# Patient Record
Sex: Female | Born: 1935 | Race: White | Hispanic: No | Smoking: Never smoker
Health system: Southern US, Community
[De-identification: ages and names within clinical notes are randomized; demographics above are authoritative.]

## PROBLEM LIST (undated history)

## (undated) DIAGNOSIS — I10 Essential (primary) hypertension: Secondary | ICD-10-CM

## (undated) DIAGNOSIS — R739 Hyperglycemia, unspecified: Secondary | ICD-10-CM

## (undated) DIAGNOSIS — Z79899 Other long term (current) drug therapy: Secondary | ICD-10-CM

## (undated) DIAGNOSIS — E039 Hypothyroidism, unspecified: Secondary | ICD-10-CM

## (undated) DIAGNOSIS — Z7901 Long term (current) use of anticoagulants: Secondary | ICD-10-CM

## (undated) DIAGNOSIS — E785 Hyperlipidemia, unspecified: Secondary | ICD-10-CM

## (undated) DIAGNOSIS — D649 Anemia, unspecified: Secondary | ICD-10-CM

## (undated) DIAGNOSIS — M8580 Other specified disorders of bone density and structure, unspecified site: Secondary | ICD-10-CM

## (undated) DIAGNOSIS — E782 Mixed hyperlipidemia: Secondary | ICD-10-CM

## (undated) DIAGNOSIS — D259 Leiomyoma of uterus, unspecified: Secondary | ICD-10-CM

## (undated) DIAGNOSIS — I4891 Unspecified atrial fibrillation: Secondary | ICD-10-CM

## (undated) HISTORY — DX: Anemia, unspecified: D64.9

## (undated) HISTORY — DX: Other long term (current) drug therapy: Z79.899

## (undated) HISTORY — DX: Unspecified atrial fibrillation: I48.91

## (undated) HISTORY — DX: Hyperlipidemia, unspecified: E78.5

## (undated) HISTORY — DX: Essential (primary) hypertension: I10

## (undated) HISTORY — DX: Hypothyroidism, unspecified: E03.9

## (undated) HISTORY — DX: Mixed hyperlipidemia: E78.2

## (undated) HISTORY — DX: Leiomyoma of uterus, unspecified: D25.9

## (undated) HISTORY — PX: APPENDECTOMY: SHX54

## (undated) HISTORY — DX: Hyperglycemia, unspecified: R73.9

## (undated) HISTORY — DX: Long term (current) use of anticoagulants: Z79.01

## (undated) HISTORY — PX: BREAST LUMPECTOMY: SHX2

## (undated) HISTORY — DX: Other specified disorders of bone density and structure, unspecified site: M85.80

## (undated) HISTORY — PX: TONSILLECTOMY: SUR1361

## (undated) HISTORY — PX: HAND SURGERY: SHX662

## (undated) HISTORY — PX: KNEE SURGERY: SHX244

---

## 2010-05-12 ENCOUNTER — Other Ambulatory Visit (HOSPITAL_COMMUNITY)
Admission: RE | Admit: 2010-05-12 | Discharge: 2010-05-12 | Disposition: A | Discharge status: Home / Self Care | Payer: Medicare Other | Source: Ambulatory Visit | Attending: Oncology | Admitting: Oncology

## 2010-05-12 ENCOUNTER — Other Ambulatory Visit: Payer: Self-pay | Admitting: Oncology

## 2010-05-12 DIAGNOSIS — D509 Iron deficiency anemia, unspecified: Secondary | ICD-10-CM | POA: Insufficient documentation

## 2014-10-28 DIAGNOSIS — I480 Paroxysmal atrial fibrillation: Secondary | ICD-10-CM

## 2014-10-28 DIAGNOSIS — I10 Essential (primary) hypertension: Secondary | ICD-10-CM

## 2014-10-28 DIAGNOSIS — E785 Hyperlipidemia, unspecified: Secondary | ICD-10-CM

## 2014-10-28 HISTORY — DX: Essential (primary) hypertension: I10

## 2014-10-28 HISTORY — DX: Paroxysmal atrial fibrillation: I48.0

## 2014-10-28 HISTORY — DX: Hyperlipidemia, unspecified: E78.5

## 2014-10-29 ENCOUNTER — Ambulatory Visit (INDEPENDENT_AMBULATORY_CARE_PROVIDER_SITE_OTHER): Payer: Medicare Other | Admitting: Pulmonary Disease

## 2014-10-29 ENCOUNTER — Encounter: Payer: Self-pay | Admitting: Pulmonary Disease

## 2014-10-29 ENCOUNTER — Other Ambulatory Visit (INDEPENDENT_AMBULATORY_CARE_PROVIDER_SITE_OTHER): Payer: Medicare Other

## 2014-10-29 ENCOUNTER — Ambulatory Visit (INDEPENDENT_AMBULATORY_CARE_PROVIDER_SITE_OTHER)
Admission: RE | Admit: 2014-10-29 | Discharge: 2014-10-29 | Disposition: A | Discharge status: Home / Self Care | Payer: Medicare Other | Source: Ambulatory Visit | Attending: Pulmonary Disease | Admitting: Pulmonary Disease

## 2014-10-29 VITALS — BP 140/66 | HR 61 | Temp 98.2°F | Ht 64.0 in | Wt 132.6 lb

## 2014-10-29 DIAGNOSIS — R0600 Dyspnea, unspecified: Secondary | ICD-10-CM

## 2014-10-29 LAB — CBC
HCT: 37.9 % (ref 36.0–46.0)
Hemoglobin: 12.8 g/dL (ref 12.0–15.0)
MCHC: 33.7 g/dL (ref 30.0–36.0)
MCV: 87.7 fl (ref 78.0–100.0)
Platelets: 215 10*3/uL (ref 150.0–400.0)
RBC: 4.32 Mil/uL (ref 3.87–5.11)
RDW: 14.2 % (ref 11.5–15.5)
WBC: 7.4 10*3/uL (ref 4.0–10.5)

## 2014-10-29 NOTE — Patient Instructions (Signed)
Get chest X ray Echocardiogram (Ultrasound of heart) Return to clinic in 2 weeks

## 2014-10-29 NOTE — Progress Notes (Addendum)
Subjective:    Patient ID: Latasha Barber, female    DOB: 01/10/1929, 81 y.o.   MRN: [id number]  HPI  Referral for evaluation of dyspnea. Question of amiodarone toxicity.  Mrs. Buxbaum is a 81 year old with history of paroxysmal A. Fib, hypertension, dyslipidemia. She had a diagnosis of paroxysmal A. Fib. She is currently on amiodarone at 100 mg and Eliquis. She complains of dyspnea on exertion for the past 1-2 weeks associated with nonproductive cough. No complaints of sputum production, wheezing, fevers, chills. Has non productive cough. No hemoptysis, fatigue or weight loss. She saw her cardiologist Dr. Geraldo Pitter who has referred her for further evaluation. She had PFTs done at [facility] on 08/06/14 which showed an isolated reduction in diffusion capacity (see results below). ROS as below.  Social History: She is a lifelong nonsmoker. No drinking history or illegal drug use. She worked in a [employer]. No exposures at work or at home.  Past medical history: Paroxysmal atrial fibrillation Essential hypertension Dyslipidemia Anemia Hematochezia Herpes zoster Hypoglycemia Hypothyroidism Leiomyoma of the uterus.  Past surgical history: Appendectomy  Allergies: None  Current medications: Amiodarone 100 mg daily Eliquis 5 mg twice a day B vitamins Calcium carbonate vitamin D3 Synthroid 100 g daily Lisinopril-hydrochlorothiazide 20-12.5 mg daily Lovastatin 40 mg at night Multivitamin  Family history: Father-coronary artery disease Mother-colon cancer Daughter.-breast cancer  Review of Systems  Constitutional: Negative for fever and unexpected weight change.  HENT: Negative for congestion, dental problem, ear pain, nosebleeds, postnasal drip, rhinorrhea, sinus pressure, sneezing, sore throat and trouble swallowing.   Eyes: Negative for redness and itching.  Respiratory: Positive for cough and shortness of breath. Negative for chest tightness and wheezing.     Cardiovascular: Negative for palpitations and leg swelling.  Gastrointestinal: Negative for nausea and vomiting.  Genitourinary: Negative for dysuria.  Musculoskeletal: Negative for joint swelling.  Skin: Negative for rash.  Neurological: Negative for headaches.  Hematological: Bruises/bleeds easily.  Psychiatric/Behavioral: Negative for dysphoric mood. The patient is not nervous/anxious.    Data from [facility] hospital: PFTs (08/06/14)  FVC 2.56 (93%) FEV1 1.90 (99%) F/F 73 TLC 5.20 (90%) DLCO 48% No BD response.  Blood pressure 140/66, pulse 61, temperature 98.2 F (36.8 C), temperature source Oral, height 5\' 4"  (1.626 m), weight 132 lb 9.6 oz (60.147 kg), SpO2 99 %.    Objective:   Physical Exam  Constitutional: She is oriented to person, place, and time. She appears well-developed and well-nourished. No distress.  HENT:  Mouth/Throat: Oropharynx is clear and moist.  Eyes: Pupils are equal, round, and reactive to light.  Neck: Normal range of motion. Neck supple.  Cardiovascular: Normal rate and normal heart sounds.   Pulmonary/Chest: Effort normal and breath sounds normal.  Abdominal: Soft. Bowel sounds are normal.  Neurological: She is alert and oriented to person, place, and time.  Skin: Skin is warm and dry.       Assessment & Plan:   Ms. Skidmore is referred for evaluation of dyspnea and a question of amiodarone toxicity. She has been on amiodarone at 100 mg for at least 2 months as per her report. Complains of dyspnea for the past 2 weeks. Review of her PFTs shows isolated reduction in the diffusion capacity. There is no restriction or obstruction. This pattern is not very characteristic of interstitial lung disease or amiodarone toxicity. It is more suggestive of a pulmonary vascular process. I will get a chest x-ray today. If there are any abnormalities on it she will need  a high-resolution CT scan of the chest. I will also order an echocardiogram to evaluate for  pulmonary hypertension or any other cardiac abnormality. She does have history of anemia which could cause a low diffusion capacity. There is no need to stop amiodarone at this point until we have clear evidence of amiodarone toxicity.  I will see her in a month after these tests to review and plan for further workup.  Plan: Chest X ray CBC to evaluate anemia. Echocardiogram  Return to clinic in 1 month  Marshell Garfinkel MD [facility] Pager [phone] If no answer or after 3pm call: [phone] 10/29/2014, 2:40 PM

## 2014-11-01 ENCOUNTER — Other Ambulatory Visit (HOSPITAL_COMMUNITY): Payer: Medicare Other

## 2014-11-29 ENCOUNTER — Ambulatory Visit: Payer: Medicare Other | Admitting: Pulmonary Disease

## 2015-04-14 DIAGNOSIS — R739 Hyperglycemia, unspecified: Secondary | ICD-10-CM

## 2015-04-14 DIAGNOSIS — I4891 Unspecified atrial fibrillation: Secondary | ICD-10-CM | POA: Insufficient documentation

## 2015-04-14 DIAGNOSIS — Z79899 Other long term (current) drug therapy: Secondary | ICD-10-CM

## 2015-04-14 DIAGNOSIS — D259 Leiomyoma of uterus, unspecified: Secondary | ICD-10-CM

## 2015-04-14 DIAGNOSIS — D649 Anemia, unspecified: Secondary | ICD-10-CM

## 2015-04-14 DIAGNOSIS — E782 Mixed hyperlipidemia: Secondary | ICD-10-CM

## 2015-04-14 DIAGNOSIS — E039 Hypothyroidism, unspecified: Secondary | ICD-10-CM

## 2015-04-14 HISTORY — DX: Hypothyroidism, unspecified: E03.9

## 2015-04-14 HISTORY — DX: Leiomyoma of uterus, unspecified: D25.9

## 2015-04-14 HISTORY — DX: Anemia, unspecified: D64.9

## 2015-04-14 HISTORY — DX: Mixed hyperlipidemia: E78.2

## 2015-04-14 HISTORY — DX: Hyperglycemia, unspecified: R73.9

## 2015-04-14 HISTORY — DX: Other long term (current) drug therapy: Z79.899

## 2016-03-22 DIAGNOSIS — M8580 Other specified disorders of bone density and structure, unspecified site: Secondary | ICD-10-CM

## 2016-03-22 HISTORY — DX: Other specified disorders of bone density and structure, unspecified site: M85.80

## 2016-11-19 DIAGNOSIS — Z7901 Long term (current) use of anticoagulants: Secondary | ICD-10-CM

## 2016-11-19 HISTORY — DX: Long term (current) use of anticoagulants: Z79.01

## 2017-04-14 IMAGING — CR DG CHEST 2V
2 series · 2 of 2 positions shown · non-contrast
Comparison: None in PACs

CLINICAL DATA: One week of dyspnea, history of atrial fibrillation
and hypertension, nonsmoker.

EXAM:
CHEST  2 VIEW

[view not recorded (1 of 2)]
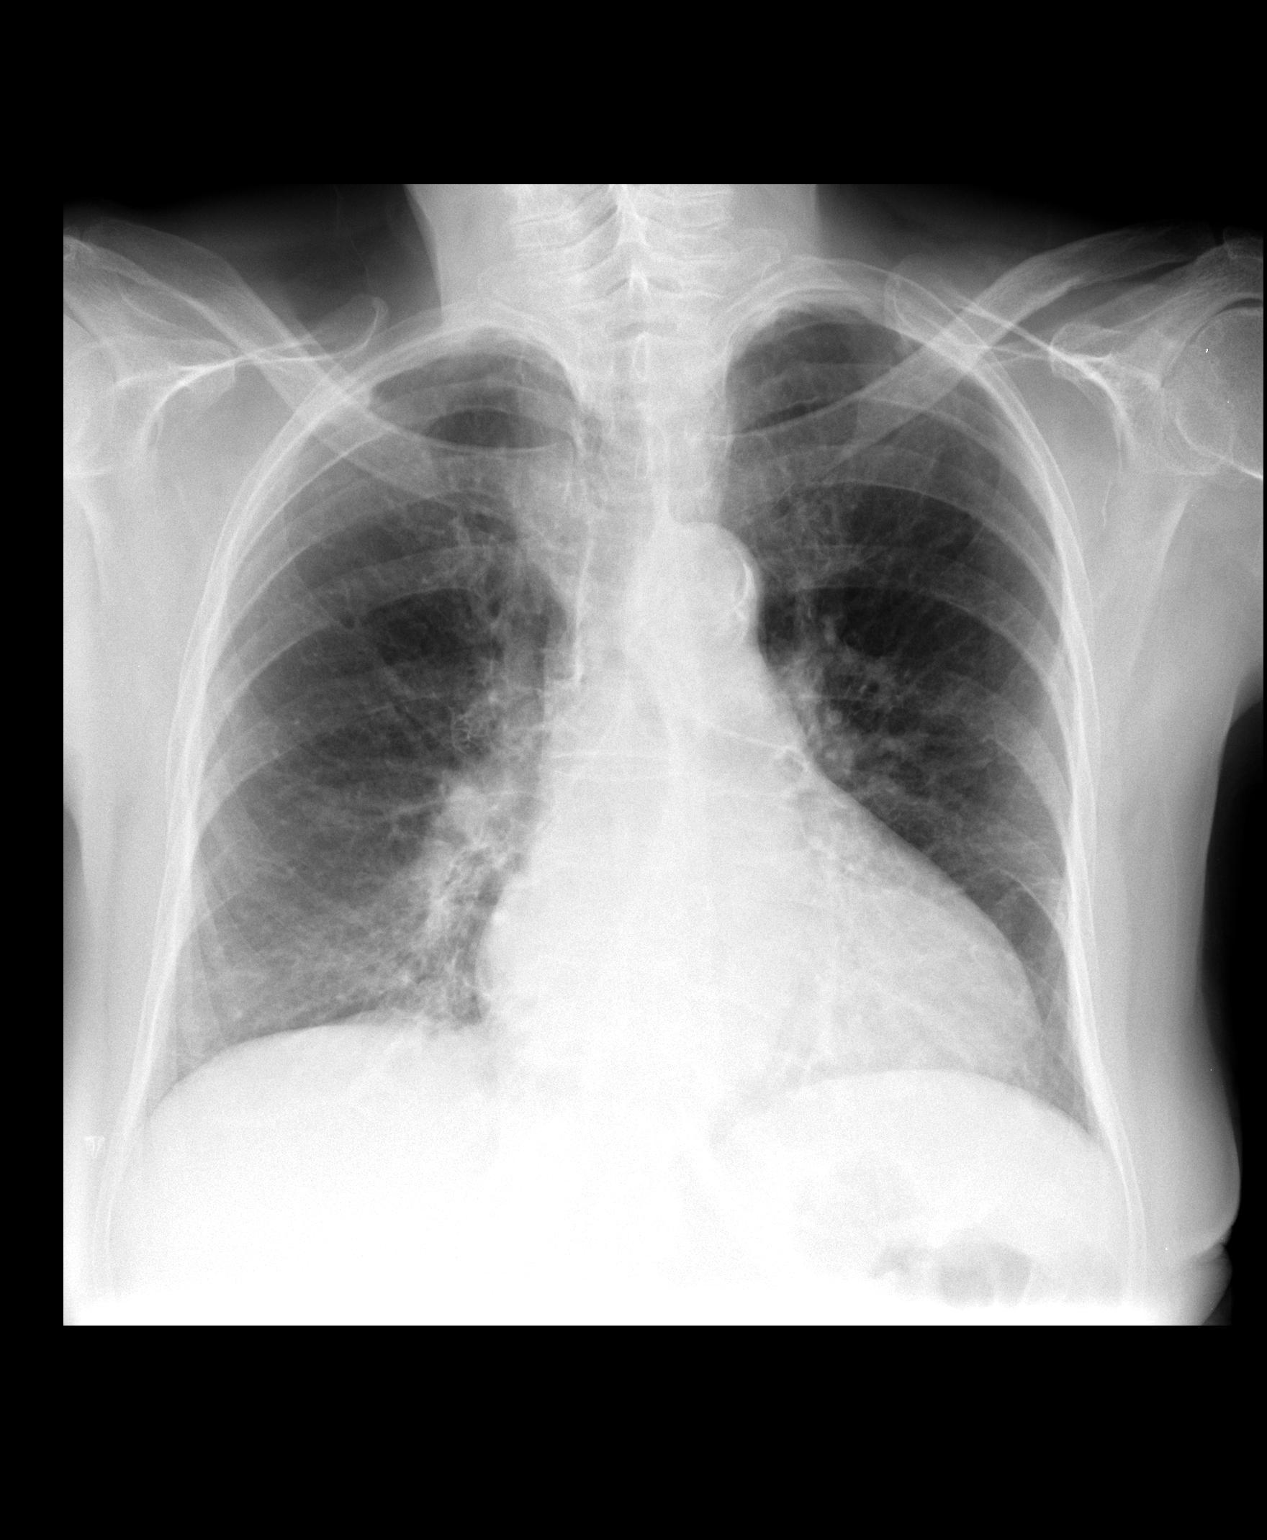

[view not recorded (2 of 2)]
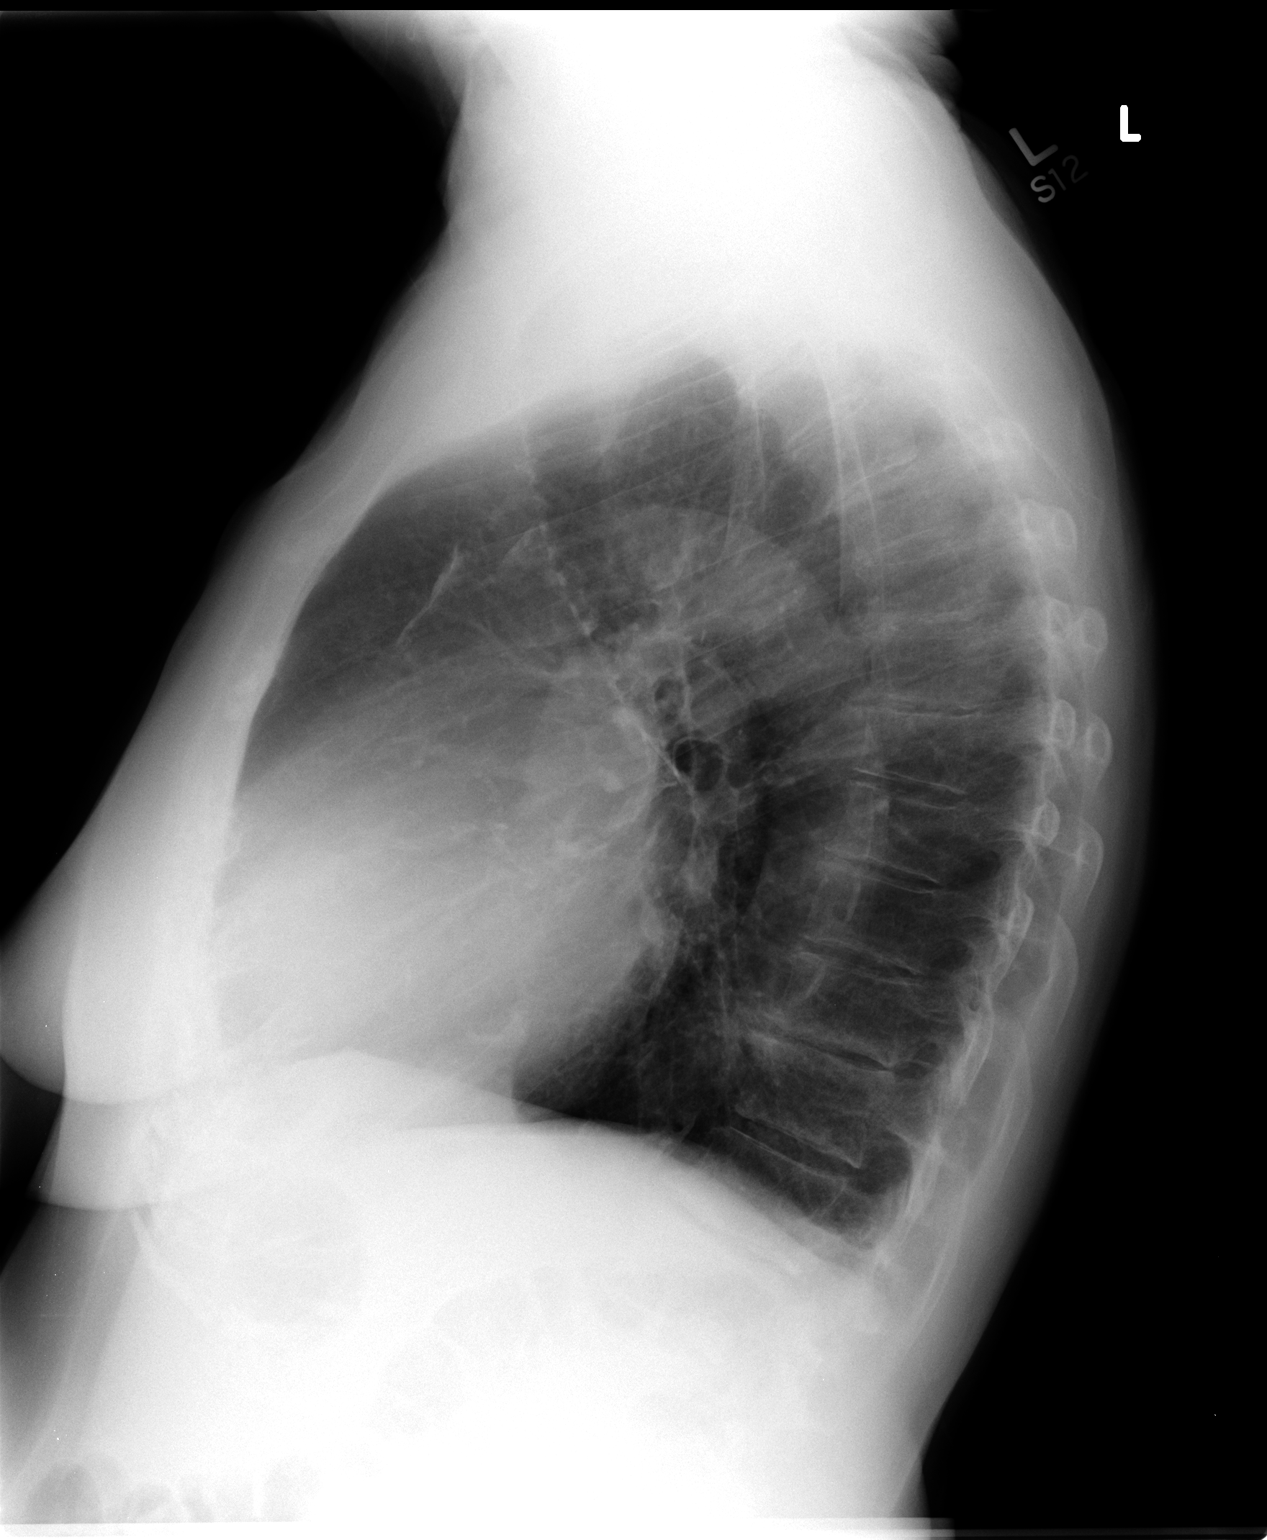

[2 of 2 positions shown; findings below may reference images not displayed]

FINDINGS: The lungs are adequately inflated. The interstitial markings are
coarse especially in the right infrahilar region on the frontal
image. This may lie posteriorly on the lateral image but is not
clearly demonstrated. The cardiac silhouette is mildly enlarged. The
pulmonary vascularity is not engorged. There is no pleural effusion.
There is calcification within the wall of the thoracic aorta. There
is mild multilevel degenerative disc disease of the thoracic spine.
IMPRESSION: Coarse infrahilar lung markings on the right may reflect
subsegmental atelectasis or early pneumonia. The findings are not
clearly triangulated on the lateral film. There is mild cardiomegaly
without pulmonary vascular congestion.

Followup PA and lateral chest X-ray is recommended in 3-4 weeks
following trial of antibiotic therapy to ensure resolution and
exclude underlying malignancy.

## 2017-12-28 ENCOUNTER — Encounter: Payer: Self-pay | Admitting: Cardiology

## 2018-01-02 ENCOUNTER — Encounter: Payer: Self-pay | Admitting: Cardiology

## 2018-01-02 ENCOUNTER — Ambulatory Visit (INDEPENDENT_AMBULATORY_CARE_PROVIDER_SITE_OTHER): Payer: Medicare Other | Admitting: Cardiology

## 2018-01-02 VITALS — BP 144/72 | HR 63 | Ht 64.0 in | Wt 131.0 lb

## 2018-01-02 DIAGNOSIS — E785 Hyperlipidemia, unspecified: Secondary | ICD-10-CM

## 2018-01-02 DIAGNOSIS — I10 Essential (primary) hypertension: Secondary | ICD-10-CM

## 2018-01-02 DIAGNOSIS — Z7901 Long term (current) use of anticoagulants: Secondary | ICD-10-CM

## 2018-01-02 DIAGNOSIS — I480 Paroxysmal atrial fibrillation: Secondary | ICD-10-CM

## 2018-01-02 DIAGNOSIS — E782 Mixed hyperlipidemia: Secondary | ICD-10-CM

## 2018-01-02 DIAGNOSIS — Z79899 Other long term (current) drug therapy: Secondary | ICD-10-CM

## 2018-01-02 LAB — LIPID PANEL
Chol/HDL Ratio: 3.2 ratio (ref 0.0–4.4)
Cholesterol, Total: 172 mg/dL (ref 100–199)
HDL: 54 mg/dL (ref >39)
LDL CALC: 98 mg/dL (ref 0–99)
TRIGLYCERIDES: 101 mg/dL (ref 0–149)
VLDL CHOLESTEROL CAL: 20 mg/dL (ref 5–40)

## 2018-01-02 LAB — BASIC METABOLIC PANEL
BUN/Creatinine Ratio: 16 (ref 12–28)
BUN: 20 mg/dL (ref 8–27)
CO2: 23 mmol/L (ref 20–29)
CREATININE: 1.28 mg/dL — AB (ref 0.57–1.00)
Calcium: 9.1 mg/dL (ref 8.7–10.3)
Chloride: 101 mmol/L (ref 96–106)
GFR calc Af Amer: 43 mL/min/{1.73_m2} — ABNORMAL LOW (ref >59)
GFR calc non Af Amer: 38 mL/min/{1.73_m2} — ABNORMAL LOW (ref >59)
GLUCOSE: 85 mg/dL (ref 65–99)
Potassium: 3.9 mmol/L (ref 3.5–5.2)
Sodium: 141 mmol/L (ref 134–144)

## 2018-01-02 LAB — CBC WITH DIFFERENTIAL/PLATELET
BASOS ABS: 0.1 10*3/uL (ref 0.0–0.2)
Basos: 1 %
EOS (ABSOLUTE): 0.3 10*3/uL (ref 0.0–0.4)
EOS: 4 %
HEMATOCRIT: 38 % (ref 34.0–46.6)
Hemoglobin: 12.8 g/dL (ref 11.1–15.9)
IMMATURE GRANULOCYTES: 0 %
Immature Grans (Abs): 0 10*3/uL (ref 0.0–0.1)
Lymphocytes Absolute: 1.9 10*3/uL (ref 0.7–3.1)
Lymphs: 28 %
MCH: 28.7 pg (ref 26.6–33.0)
MCHC: 33.7 g/dL (ref 31.5–35.7)
MCV: 85 fL (ref 79–97)
MONOS ABS: 0.7 10*3/uL (ref 0.1–0.9)
Monocytes: 10 %
NEUTROS PCT: 57 %
Neutrophils Absolute: 4 10*3/uL (ref 1.4–7.0)
PLATELETS: 244 10*3/uL (ref 150–450)
RBC: 4.46 x10E6/uL (ref 3.77–5.28)
RDW: 12.6 % (ref 12.3–15.4)
WBC: 7 10*3/uL (ref 3.4–10.8)

## 2018-01-02 LAB — HEPATIC FUNCTION PANEL
ALBUMIN: 4.2 g/dL (ref 3.5–4.7)
ALK PHOS: 87 IU/L (ref 39–117)
ALT: 13 IU/L (ref 0–32)
AST: 22 IU/L (ref 0–40)
Bilirubin Total: 0.5 mg/dL (ref 0.0–1.2)
Bilirubin, Direct: 0.14 mg/dL (ref 0.00–0.40)
TOTAL PROTEIN: 6.6 g/dL (ref 6.0–8.5)

## 2018-01-02 LAB — TSH: TSH: 4.87 u[IU]/mL — AB (ref 0.450–4.500)

## 2018-01-02 NOTE — Addendum Note (Signed)
Addended by: Mattie Marlin on: 01/02/2018 10:07 AM   Modules accepted: Orders

## 2018-01-02 NOTE — Progress Notes (Signed)
Cardiology Office Note:    Date:  01/02/2018   ID:  Latasha Barber, DOB 27-Jun-1928, MRN [id number]  PCP:  Raina Mina., MD  Cardiologist:  Jenean Lindau, MD   Referring MD: Raina Mina., MD    ASSESSMENT:    1. AF (paroxysmal atrial fibrillation) ([place])   2. Essential (primary) hypertension   3. Mixed hyperlipidemia   4. Anticoagulant long-term use   5. Dyslipidemia    PLAN:    In order of problems listed above:  1. Primary prevention stressed with the patient.  Importance of compliance with diet and medication stressed and she vocalized understanding.  Her blood pressure is stable.  Diet was discussed for dyslipidemia. 2. I discussed with the patient atrial fibrillation, disease process. Management and therapy including rate and rhythm control, anticoagulation benefits and potential risks were discussed extensively with the patient. Patient had multiple questions which were answered to patient's satisfaction. 3. She has not had blood work for quite some time we will do blood work today including fasting lipids and liver too.  Echocardiogram will be done to assess murmur heard in auscultation.Patient will be seen in follow-up appointment in 6 months or earlier if the patient has any concerns 4. Since she is on amiodarone therapy we will send her for a chest x-ray and pulmonary function testing.  She has regular appointments with her eye doctor and sees them on a regular basis.   Medication Adjustments/Labs and Tests Ordered: Current medicines are reviewed at length with the patient today.  Concerns regarding medicines are outlined above.  No orders of the defined types were placed in this encounter.  No orders of the defined types were placed in this encounter.    History of Present Illness:    Latasha Barber is a 84 y.o. female who is being seen today for the evaluation of paroxysmal atrial fibrillation at the request of Raina Mina., MD.  Patient is a pleasant  84 year old female.  She has past medical history approximately fibrillation, essential hypertension and dyslipidemia.  She denies any problems at this time and takes care of activities of daily living.  No chest pain orthopnea or PND.  This patient has been under my care in my previous practice.  He is here now to transfer his care and be established with my current practice.  At the time of my evaluation, the patient is alert awake oriented and in no distress.  Past Medical History:  Diagnosis Date  . Acquired hypothyroidism 04/14/2015   Last Assessment & Plan:  Update her TSH for her today and refill her med for her through the mail order  . Anemia 04/14/2015   Last Assessment & Plan:  Update her CBC and ensure that this is stable for her  . Anticoagulant long-term use 11/19/2016  . Atrial fibrillation ([place])   . Dyslipidemia 10/28/2014  . Essential (primary) hypertension 10/28/2014  . Hyperglycemia 04/14/2015   Last Assessment & Plan:  She is doing well with her diet and she is going to have labs drawn for her sugar overall  . Hyperlipemia   . Hypertension   . Leiomyoma of uterus 04/14/2015  . Long-term use of high-risk medication 04/14/2015  . Mixed hyperlipidemia 04/14/2015   Last Assessment & Plan:  Update the lipids for her today and she is going to have this done  . Osteopenia 03/22/2016   2012: -1.2    Past Surgical History:  Procedure Laterality Date  . APPENDECTOMY    .  HAND SURGERY    . KNEE SURGERY    . TONSILLECTOMY      Current Medications: Current Meds  Medication Sig  . amiodarone (PACERONE) 200 MG tablet Take 1 tablet by mouth daily.  Marland Kitchen apixaban (ELIQUIS) 5 MG TABS tablet Take 5 mg by mouth 2 (two) times daily.  Marland Kitchen b complex vitamins capsule Take 1 capsule by mouth daily.  . Calcium Carbonate-Vitamin D 600-400 MG-UNIT per tablet Take 1 tablet by mouth 2 (two) times daily.  . ferrous sulfate 325 (65 FE) MG tablet Take 1 tablet by mouth daily.  Marland Kitchen levothyroxine (SYNTHROID,  LEVOTHROID) 112 MCG tablet Take 1 tablet by mouth daily.  Marland Kitchen lisinopril-hydrochlorothiazide (PRINZIDE,ZESTORETIC) 20-12.5 MG per tablet Take 1 tablet by mouth daily.  Marland Kitchen lovastatin (MEVACOR) 40 MG tablet Take 40 mg by mouth at bedtime.  . Multiple Vitamin (MULTIVITAMIN) capsule Take 1 capsule by mouth daily.     Allergies:   Patient has no known allergies.   Social History   Socioeconomic History  . Marital status: Widowed    Spouse name: Not on file  . Number of children: Not on file  . Years of education: Not on file  . Highest education level: Not on file  Occupational History  . Occupation: Part Time Dealer    Comment: [employer]  Social Needs  . Financial resource strain: Not on file  . Food insecurity:    Worry: Not on file    Inability: Not on file  . Transportation needs:    Medical: Not on file    Non-medical: Not on file  Tobacco Use  . Smoking status: Never Smoker  . Smokeless tobacco: Never Used  Substance and Sexual Activity  . Alcohol use: No    Alcohol/week: 0.0 standard drinks  . Drug use: No  . Sexual activity: Not on file  Lifestyle  . Physical activity:    Days per week: Not on file    Minutes per session: Not on file  . Stress: Not on file  Relationships  . Social connections:    Talks on phone: Not on file    Gets together: Not on file    Attends religious service: Not on file    Active member of club or organization: Not on file    Attends meetings of clubs or organizations: Not on file    Relationship status: Not on file  Other Topics Concern  . Not on file  Social History Narrative  . Not on file     Family History: The patient's family history includes Breast cancer in her daughter; Cancer in her mother.  ROS:   Please see the history of present illness.    All other systems reviewed and are negative.  EKGs/Labs/Other Studies Reviewed:    The following studies were reviewed today: EKG reveals sinus rhythm and  nonspecific ST-T changes.   Recent Labs: No results found for requested labs within last 8760 hours.  Recent Lipid Panel No results found for: CHOL, TRIG, HDL, CHOLHDL, VLDL, LDLCALC, LDLDIRECT  Physical Exam:    VS:  BP (!) 144/72 (BP Location: Left Arm, Patient Position: Sitting, Cuff Size: Normal)   Pulse 63   Ht 5\' 4"  (1.626 m)   Wt 131 lb (59.4 kg)   SpO2 95%   BMI 22.49 kg/m     Wt Readings from Last 3 Encounters:  01/02/18 131 lb (59.4 kg)  10/29/14 132 lb 9.6 oz (60.1 kg)     GEN:  Patient is in no acute distress HEENT: Normal NECK: No JVD; No carotid bruits LYMPHATICS: No lymphadenopathy CARDIAC: S1 S2 regular, 2/6 systolic murmur at the apex. RESPIRATORY:  Clear to auscultation without rales, wheezing or rhonchi  ABDOMEN: Soft, non-tender, non-distended MUSCULOSKELETAL:  No edema; No deformity  SKIN: Warm and dry NEUROLOGIC:  Alert and oriented x 3 PSYCHIATRIC:  Normal affect    Signed, Jenean Lindau, MD  01/02/2018 9:54 AM    [facility]

## 2018-01-02 NOTE — Patient Instructions (Signed)
Medication Instructions:  Your physician recommends that you continue on your current medications as directed. Please refer to the Current Medication list given to you today.  If you need a refill on your cardiac medications before your next appointment, please call your pharmacy.   Lab work: Your physician recommends that you have the following labs drawn: CBC, BMP, TSH, liver and lipid panel today.  If you have labs (blood work) drawn today and your tests are completely normal, you will receive your results only by: Marland Kitchen MyChart Message (if you have MyChart) OR . A paper copy in the mail If you have any lab test that is abnormal or we need to change your treatment, we will call you to review the results.  Testing/Procedures: Your physician has requested that you have an echocardiogram. Echocardiography is a painless test that uses sound waves to create images of your heart. It provides your doctor with information about the size and shape of your heart and how well your heart's chambers and valves are working. This procedure takes approximately one hour. There are no restrictions for this procedure.  Your physician has recommended that you have a pulmonary function test. Pulmonary Function Tests are a group of tests that measure how well air moves in and out of your lungs.  A chest x-ray takes a picture of the organs and structures inside the chest, including the heart, lungs, and blood vessels. This test can show several things, including, whether the heart is enlarges; whether fluid is building up in the lungs; and whether pacemaker / defibrillator leads are still in place.  Follow-Up: At [facility], you and your health needs are our priority.  As part of our continuing mission to provide you with exceptional heart care, we have created designated Provider Care Teams.  These Care Teams include your primary Cardiologist (physician) and Advanced Practice Providers (APPs -  Physician  Assistants and Nurse Practitioners) who all work together to provide you with the care you need, when you need it.  You will need a follow up appointment in 6 months.  Please call our office 2 months in advance to schedule this appointment.  You may see another member of our [employer] Provider Team in [place]: Jenne Campus, MD . Shirlee More, MD  Any Other Special Instructions Will Be Listed Below (If Applicable).  You have been referred to pulmonary for PFT's    [place], RN, BSN

## 2018-01-03 ENCOUNTER — Telehealth: Payer: Self-pay

## 2018-01-03 ENCOUNTER — Other Ambulatory Visit: Payer: Self-pay

## 2018-01-03 DIAGNOSIS — R7989 Other specified abnormal findings of blood chemistry: Secondary | ICD-10-CM

## 2018-01-03 NOTE — Telephone Encounter (Signed)
Left voicemail for the patient to call the office to discuss results. 

## 2018-01-17 ENCOUNTER — Ambulatory Visit (HOSPITAL_COMMUNITY)
Admission: RE | Admit: 2018-01-17 | Discharge: 2018-01-17 | Disposition: A | Discharge status: Home / Self Care | Payer: Medicare Other | Source: Ambulatory Visit | Attending: Cardiology | Admitting: Cardiology

## 2018-01-17 DIAGNOSIS — Z79899 Other long term (current) drug therapy: Secondary | ICD-10-CM | POA: Diagnosis present

## 2018-01-17 DIAGNOSIS — I480 Paroxysmal atrial fibrillation: Secondary | ICD-10-CM

## 2018-01-17 LAB — PULMONARY FUNCTION TEST
DL/VA % PRED: 66 %
DL/VA: 3.17 ml/min/mmHg/L
DLCO UNC % PRED: 49 %
DLCO cor % pred: 50 %
DLCO cor: 12.27 ml/min/mmHg
DLCO unc: 12.04 ml/min/mmHg
FEF 25-75 PRE: 1.17 L/s
FEF 25-75 Post: 1.38 L/sec
FEF2575-%CHANGE-POST: 17 %
FEF2575-%PRED-POST: 132 %
FEF2575-%Pred-Pre: 112 %
FEV1-%Change-Post: 5 %
FEV1-%PRED-PRE: 99 %
FEV1-%Pred-Post: 104 %
FEV1-Post: 1.77 L
FEV1-Pre: 1.67 L
FEV1FVC-%CHANGE-POST: 8 %
FEV1FVC-%Pred-Pre: 101 %
FEV6-%CHANGE-POST: -1 %
FEV6-%Pred-Post: 103 %
FEV6-%Pred-Pre: 105 %
FEV6-Post: 2.23 L
FEV6-Pre: 2.26 L
FEV6FVC-%Change-Post: 1 %
FEV6FVC-%Pred-Post: 106 %
FEV6FVC-%Pred-Pre: 105 %
FVC-%Change-Post: -2 %
FVC-%Pred-Post: 97 %
FVC-%Pred-Pre: 99 %
FVC-Post: 2.23 L
FVC-Pre: 2.29 L
POST FEV1/FVC RATIO: 79 %
POST FEV6/FVC RATIO: 100 %
PRE FEV1/FVC RATIO: 73 %
Pre FEV6/FVC Ratio: 99 %
RV % pred: 79 %
RV: 2.02 L
TLC % pred: 86 %
TLC: 4.39 L

## 2018-01-17 MED ORDER — ALBUTEROL SULFATE (2.5 MG/3ML) 0.083% IN NEBU
2.5000 mg | INHALATION_SOLUTION | Freq: Once | RESPIRATORY_TRACT | Status: AC
  Administered 2018-01-17: 2.5 mg via RESPIRATORY_TRACT

## 2018-01-18 ENCOUNTER — Telehealth: Payer: Self-pay | Admitting: Pulmonary Disease

## 2018-01-18 NOTE — Telephone Encounter (Signed)
rec'd message from [place] with cardiology stating order for pulmonary consult was scheduled in 01/02/18 Pt has not scheduled appt with our office for pulmonary consult at this time LVM for pt call back today to schedule pulmonary consult with one of our physicians. Will try again later this afternoon.

## 2018-01-18 NOTE — Telephone Encounter (Signed)
Called and spoke with pt regarding scheduling pulmonary consult scheduled for pulmonary consult 01/23/18 at [place] office with BI Pt verbalized understanding, wrote down new [place] office address, had no concerns Nothing further needed.

## 2018-01-18 NOTE — Telephone Encounter (Signed)
Pt is calling back [phone]

## 2018-01-23 ENCOUNTER — Institutional Professional Consult (permissible substitution): Payer: Medicare Other | Admitting: Pulmonary Disease

## 2018-01-24 DIAGNOSIS — R942 Abnormal results of pulmonary function studies: Secondary | ICD-10-CM

## 2018-01-24 DIAGNOSIS — N1831 Chronic kidney disease, stage 3a: Secondary | ICD-10-CM

## 2018-01-24 DIAGNOSIS — N183 Chronic kidney disease, stage 3 unspecified: Secondary | ICD-10-CM | POA: Insufficient documentation

## 2018-01-24 HISTORY — DX: Abnormal results of pulmonary function studies: R94.2

## 2018-01-24 HISTORY — DX: Chronic kidney disease, stage 3 unspecified: N18.30

## 2018-01-31 ENCOUNTER — Institutional Professional Consult (permissible substitution): Payer: Medicare Other | Admitting: Pulmonary Disease

## 2018-02-24 ENCOUNTER — Ambulatory Visit (INDEPENDENT_AMBULATORY_CARE_PROVIDER_SITE_OTHER): Payer: Medicare Other

## 2018-02-24 DIAGNOSIS — I10 Essential (primary) hypertension: Secondary | ICD-10-CM

## 2018-02-24 NOTE — Progress Notes (Signed)
Complete echocardiogram has been performed.  Jimmy Maziyah Vessel, RDCS, RVT 

## 2018-04-05 ENCOUNTER — Other Ambulatory Visit: Payer: Self-pay | Admitting: Cardiology

## 2018-04-05 DIAGNOSIS — R7989 Other specified abnormal findings of blood chemistry: Secondary | ICD-10-CM

## 2018-05-29 ENCOUNTER — Other Ambulatory Visit: Payer: Self-pay

## 2018-05-29 ENCOUNTER — Ambulatory Visit: Payer: Medicare Other | Admitting: Allergy and Immunology

## 2018-05-29 ENCOUNTER — Encounter: Payer: Self-pay | Admitting: Allergy and Immunology

## 2018-05-29 VITALS — BP 118/74 | HR 72 | Temp 98.6°F | Resp 20 | Ht 61.5 in | Wt 129.2 lb

## 2018-05-29 DIAGNOSIS — L500 Allergic urticaria: Secondary | ICD-10-CM

## 2018-05-29 DIAGNOSIS — L308 Other specified dermatitis: Secondary | ICD-10-CM | POA: Diagnosis not present

## 2018-05-29 DIAGNOSIS — T50905D Adverse effect of unspecified drugs, medicaments and biological substances, subsequent encounter: Secondary | ICD-10-CM

## 2018-05-29 DIAGNOSIS — L989 Disorder of the skin and subcutaneous tissue, unspecified: Secondary | ICD-10-CM

## 2018-05-29 MED ORDER — TRIAMCINOLONE ACETONIDE 0.1 % EX CREA: TOPICAL_CREAM | CUTANEOUS | 3 refills | Status: DC

## 2018-05-29 MED ORDER — CETIRIZINE HCL 10 MG PO TABS: ORAL_TABLET | ORAL | 5 refills | Status: DC

## 2018-05-29 NOTE — Patient Instructions (Addendum)
  1.  Cetirizine 10 mg -1-2 tablets 1-2 times a day (max=40mg  / day).  Limiting side effects include sedation and dryness.  2.  Continue topical triamcinolone 1-2 times a day to skin lesions until resolved  3.  As long as improvement continues there is no need for any further evaluation.  Would remain away from collagenase in the future.  4.  Contact clinic if improvement does not continue

## 2018-05-29 NOTE — Progress Notes (Signed)
[place] - [place] - [place] - [place] - [place]   Dear Dr. Samule Dry,  Thank you for referring Latasha Barber to the [facility] on 05/29/2018.   Below is a summation of this patient's evaluation and recommendations.  Thank you for your referral. I will keep you informed about this patient's response to treatment.   If you have any questions please do not hesitate to contact me.   Sincerely,  Jiles Prows, MD Allergy / Immunology [place]   ______________________________________________________________________    NEW PATIENT NOTE  Referring Provider: Joya Salm, MD Primary Provider: Raina Mina., MD Date of office visit: 05/29/2018    Subjective:   Chief Complaint:  Latasha Barber (DOB: 1928/10/15) is a 85 y.o. female who presents to the clinic on 05/29/2018 with a chief complaint of Rash .     HPI: Shanaye presents to this clinic in evaluation of dermatitis.  She states that about 3 months ago she received an injection in her right pinky of collagenase and within 2 days of receiving this injection she started to develop red raised itchy lesions on her skin that she would excoriate and they would occasionally blister.  They involve multiple areas of her body.  She was evaluated by her primary care doctor and treated with an antibiotic and a systemic steroid and subsequently saw [facility] dermatology who gave her topical triamcinolone.  At this point she is about 75% better.  She still has some red itchy lesions that come up but certainly not as frequent or as intense as her initial presentation.  Her newest lesion is the one affecting her left inner elbow.  She has no associated systemic or constitutional symptoms.  She does not really note any other obvious provoking factor giving rise to this issue besides for her injection of collagenase.  She has not started a new  medication during this timeframe nor has she started a new hobby or is taking any type of supplement or health food or energy booster or vitamin in her environment has not really changed any significant degree.  Past Medical History:  Diagnosis Date  . Acquired hypothyroidism 04/14/2015   Last Assessment & Plan:  Update her TSH for her today and refill her med for her through the mail order  . Anemia 04/14/2015   Last Assessment & Plan:  Update her CBC and ensure that this is stable for her  . Anticoagulant long-term use 11/19/2016  . Atrial fibrillation ([place])   . Dyslipidemia 10/28/2014  . Essential (primary) hypertension 10/28/2014  . Hyperglycemia 04/14/2015   Last Assessment & Plan:  She is doing well with her diet and she is going to have labs drawn for her sugar overall  . Hyperlipemia   . Hypertension   . Leiomyoma of uterus 04/14/2015  . Long-term use of high-risk medication 04/14/2015  . Mixed hyperlipidemia 04/14/2015   Last Assessment & Plan:  Update the lipids for her today and she is going to have this done  . Osteopenia 03/22/2016   2012: -1.2    Past Surgical History:  Procedure Laterality Date  . APPENDECTOMY    . BREAST LUMPECTOMY    . HAND SURGERY    . KNEE SURGERY    . TONSILLECTOMY      Allergies as of 05/29/2018   No Known Allergies     Medication List      amiodarone 200 MG  tablet Commonly known as:  PACERONE Take 0.5 tablets by mouth daily.   apixaban 5 MG Tabs tablet Commonly known as:  ELIQUIS Take 5 mg by mouth 2 (two) times daily.   b complex vitamins capsule Take 1 capsule by mouth daily.   Calcium Carbonate-Vitamin D 600-400 MG-UNIT tablet Take 1 tablet by mouth 2 (two) times daily.   ferrous sulfate 325 (65 FE) MG tablet Take 1 tablet by mouth daily.   levothyroxine 112 MCG tablet Commonly known as:  SYNTHROID, LEVOTHROID TAKE 1 TABLET BY MOUTH  DAILY AT 6AM   lisinopril-hydrochlorothiazide 20-12.5 MG tablet Commonly known as:   PRINZIDE,ZESTORETIC Take 1 tablet by mouth daily.   lovastatin 40 MG tablet Commonly known as:  MEVACOR Take 40 mg by mouth at bedtime.   triamcinolone cream 0.1 % Commonly known as:  KENALOG APPLY BID TO AFFECTED AREA FOR 2 WEEKS THEN PRN FOR ITCHING       Review of systems negative except as noted in HPI / PMHx or noted below:  Review of Systems  Constitutional: Negative.   HENT: Negative.   Eyes: Negative.   Respiratory: Negative.   Cardiovascular: Negative.   Gastrointestinal: Negative.   Genitourinary: Negative.   Musculoskeletal: Negative.   Skin: Negative.   Neurological: Negative.   Endo/Heme/Allergies: Negative.   Psychiatric/Behavioral: Negative.     Family History  Problem Relation Age of Onset  . Breast cancer Daughter   . Cancer Mother   . COPD Sister   . COPD Brother   . Alzheimer's disease Sister   . Multiple myeloma Sister     Social History   Socioeconomic History  . Marital status: Widowed    Spouse name: Not on file  . Number of children: Not on file  . Years of education: Not on file  . Highest education level: Not on file  Occupational History  . Occupation: Part Time Dealer    Comment: [employer]  Social Needs  . Financial resource strain: Not on file  . Food insecurity:    Worry: Not on file    Inability: Not on file  . Transportation needs:    Medical: Not on file    Non-medical: Not on file  Tobacco Use  . Smoking status: Never Smoker  . Smokeless tobacco: Never Used  Substance and Sexual Activity  . Alcohol use: No    Alcohol/week: 0.0 standard drinks  . Drug use: No  . Sexual activity: Not on file  Lifestyle  . Physical activity:    Days per week: Not on file    Minutes per session: Not on file  . Stress: Not on file  Relationships  . Social connections:    Talks on phone: Not on file    Gets together: Not on file    Attends religious service: Not on file    Active member of club or organization:  Not on file    Attends meetings of clubs or organizations: Not on file    Relationship status: Not on file  . Intimate partner violence:    Fear of current or ex partner: Not on file    Emotionally abused: Not on file    Physically abused: Not on file    Forced sexual activity: Not on file  Other Topics Concern  . Not on file  Social History Narrative  . Not on file    Environmental and Social history  Lives in a house with a dry environment, no animals located  inside the household, no plastic on the bed, no plastic on the pillow, and no smoking ongoing with inside the household.  Objective:   Vitals:   05/29/18 1336  BP: 118/74  Pulse: 72  Resp: 20  Temp: 98.6 F (37 C)  SpO2: 98%   Height: 5' 1.5" (156.2 cm) Weight: 129 lb 3.2 oz (58.6 kg)  Physical Exam Constitutional:      Appearance: She is not diaphoretic.  HENT:     Head: Normocephalic. No right periorbital erythema or left periorbital erythema.     Right Ear: Tympanic membrane, ear canal and external ear normal.     Left Ear: Tympanic membrane, ear canal and external ear normal.     Nose: Nose normal. No mucosal edema or rhinorrhea.     Mouth/Throat:     Pharynx: Uvula midline. No oropharyngeal exudate.  Eyes:     General: Lids are normal.     Conjunctiva/sclera: Conjunctivae normal.     Pupils: Pupils are equal, round, and reactive to light.  Neck:     Thyroid: No thyromegaly.     Trachea: Trachea normal. No tracheal tenderness or tracheal deviation.  Cardiovascular:     Heart sounds: Normal heart sounds, S1 normal and S2 normal. No murmur.  Pulmonary:     Effort: Pulmonary effort is normal. No respiratory distress.     Breath sounds: Normal breath sounds. No stridor. No wheezing or rales.  Chest:     Chest wall: No tenderness.  Abdominal:     General: There is no distension.     Palpations: Abdomen is soft. There is no mass.     Tenderness: There is no abdominal tenderness. There is no guarding or  rebound.  Musculoskeletal:        General: No tenderness.  Lymphadenopathy:     Head:     Right side of head: No tonsillar adenopathy.     Left side of head: No tonsillar adenopathy.     Cervical: No cervical adenopathy.  Skin:    Coloration: Skin is not pale.     Findings: Rash (10 x 5 cm erythematous slightly indurated area affecting left elbow with obvious excoriation.) present. No erythema.     Nails: There is no clubbing.   Neurological:     Mental Status: She is alert.     Diagnostics: Allergy skin tests were not performed.  Results of blood tests obtained for November 2019 identified creatinine 1.28 mg/DL, AST 20 2U/L, ALT 13 U/L, WBC 7.0, absolute eosinophil 300, absolute lymphocyte 1900, hemoglobin 12.8, platelet 244, TSH 4.870 IU/mL  Results of blood tests obtained 22 April 2018 identified creatinine 1.09 mg/DL, AST 17 U/L, ALT 12 U/L, WBC 8.2, absolute eosinophil 400, absolute lymphocyte 1700, hemoglobin 13.0, platelet 214.   Assessment and Plan:    1. Adverse drug effect, subsequent encounter   2. Inflammatory dermatosis   3. Allergic urticaria     1.  Cetirizine 10 mg -1-2 tablets 1-2 times a day (max=20m / day).  Limiting side effects include sedation and dryness.  2.  Continue topical triamcinolone 1-2 times a day to skin lesions until resolved  3.  As long as improvement continues there is no need for any further evaluation.  Would remain away from collagenase in the future.  4.  Contact clinic if improvement does not continue  ALadajahhad some form of immunological hyperreactivity giving rise to an inflammatory dermatosis and urticaria soon after receiving her collagenase injection.  It is  quite possible that this protein administration has given rise to an activation of her immune system.  At this point I would recommend that she not receive collagenase in the future.  Fortunately, she is improving with time and thus there is no need for any further  evaluation at this point with the assumption is that she will continue to improve and resolve this issue over the course of the next month or so.  She will contact me noting her response to the plan as described above.  Further evaluation and treatment will be based upon her response.  Jiles Prows, MD Allergy / Immunology [facility]

## 2018-05-30 ENCOUNTER — Encounter: Payer: Self-pay | Admitting: Allergy and Immunology

## 2018-06-16 ENCOUNTER — Encounter: Payer: Self-pay | Admitting: Cardiology

## 2018-06-16 ENCOUNTER — Telehealth: Payer: Self-pay | Admitting: Cardiology

## 2018-06-16 ENCOUNTER — Other Ambulatory Visit: Payer: Self-pay

## 2018-06-16 ENCOUNTER — Ambulatory Visit (INDEPENDENT_AMBULATORY_CARE_PROVIDER_SITE_OTHER): Payer: Medicare Other | Admitting: Cardiology

## 2018-06-16 VITALS — Ht 61.5 in | Wt 130.0 lb

## 2018-06-16 DIAGNOSIS — I10 Essential (primary) hypertension: Secondary | ICD-10-CM

## 2018-06-16 DIAGNOSIS — Z13220 Encounter for screening for lipoid disorders: Secondary | ICD-10-CM | POA: Diagnosis not present

## 2018-06-16 DIAGNOSIS — I480 Paroxysmal atrial fibrillation: Secondary | ICD-10-CM | POA: Diagnosis not present

## 2018-06-16 NOTE — Telephone Encounter (Signed)
Virtual Visit Pre-Appointment Phone Call  Steps For Call:  1. Confirm consent - "In the setting of the current Covid19 crisis, you are scheduled for a (phone or video) visit with your provider on (date) at (time).  Just as we do with many in-office visits, in order for you to participate in this visit, we must obtain consent.  If you'd like, I can send this to your mychart (if signed up) or email for you to review.  Otherwise, I can obtain your verbal consent now.  All virtual visits are billed to your insurance company just like a normal visit would be.  By agreeing to a virtual visit, we'd like you to understand that the technology does not allow for your provider to perform an examination, and thus may limit your provider's ability to fully assess your condition.  Finally, though the technology is pretty good, we cannot assure that it will always work on either your or our end, and in the setting of a video visit, we may have to convert it to a phone-only visit.  In either situation, we cannot ensure that we have a secure connection.  Are you willing to proceed?" STAFF: Did the patient verbally acknowledge consent to telehealth visit? Document YES/NO here: YES  2. Confirm the BEST phone number to call the day of the visit by including in appointment notes  3. Give patient instructions for WebEx/MyChart download to smartphone as below or Doximity/Doxy.me if video visit (depending on what platform provider is using)  4. Advise patient to be prepared with their blood pressure, heart rate, weight, any heart rhythm information, their current medicines, and a piece of paper and pen handy for any instructions they may receive the day of their visit  5. Inform patient they will receive a phone call 15 minutes prior to their appointment time (may be from unknown caller ID) so they should be prepared to answer  6. Confirm that appointment type is correct in Epic appointment notes (VIDEO vs  PHONE)     TELEPHONE CALL NOTE  Latasha Barber has been deemed a candidate for a follow-up tele-health visit to limit community exposure during the Covid-19 pandemic. I spoke with the patient via phone to ensure availability of phone/video source, confirm preferred email & phone number, and discuss instructions and expectations.  I reminded Latasha Barber to be prepared with any vital sign and/or heart rhythm information that could potentially be obtained via home monitoring, at the time of her visit. I reminded Latasha Barber to expect a phone call at the time of her visit if her visit.  Frederic Jericho 06/16/2018 9:14 AM   INSTRUCTIONS FOR DOWNLOADING THE WEBEX APP TO SMARTPHONE  - If Apple, ask patient to go to App Store and type in WebEx in the search bar. [employer], the blue/green circle. If Android, go to [employer] and type in [employer] in the search bar. The app is free but as with any other app downloads, their phone may require them to verify saved payment information or Apple/Android password.  - The patient does NOT have to create an account. - On the day of the visit, the assist will walk the patient through joining the meeting with the meeting number/password.  INSTRUCTIONS FOR DOWNLOADING THE MYCHART APP TO SMARTPHONE  - The patient must first make sure to have activated MyChart and know their login information - If Apple, go to [employer] and type in MyChart in the  search bar and download the app. If Android, ask patient to go to [employer] and type in [place] in the search bar and download the app. The app is free but as with any other app downloads, their phone may require them to verify saved payment information or Apple/Android password.  - The patient will need to then log into the app with their MyChart username and password, and select [facility] as their healthcare provider to link the account. When it is time for your visit, go to the  MyChart app, find appointments, and click Begin Video Visit. Be sure to Select Allow for your device to access the Microphone and Camera for your visit. You will then be connected, and your provider will be with you shortly.  **If they have any issues connecting, or need assistance please contact MyChart service desk [phone])**  **If using a computer, in order to ensure the best quality for their visit they will need to use either of the following Internet Browsers: [employer], or Google Chrome**  IF USING DOXIMITY or DOXY.ME - The patient will receive a link just prior to their visit, either by text or email (to be determined day of appointment depending on if it's doxy.me or Doximity).     FULL LENGTH CONSENT FOR TELE-HEALTH VISIT   I hereby voluntarily request, consent and authorize [facility] and its employed or contracted physicians, physician assistants, nurse practitioners or other licensed health care professionals (the Practitioner), to provide me with telemedicine health care services (the Services") as deemed necessary by the treating Practitioner. I acknowledge and consent to receive the Services by the Practitioner via telemedicine. I understand that the telemedicine visit will involve communicating with the Practitioner through live audiovisual communication technology and the disclosure of certain medical information by electronic transmission. I acknowledge that I have been given the opportunity to request an in-person assessment or other available alternative prior to the telemedicine visit and am voluntarily participating in the telemedicine visit.  I understand that I have the right to withhold or withdraw my consent to the use of telemedicine in the course of my care at any time, without affecting my right to future care or treatment, and that the Practitioner or I may terminate the telemedicine visit at any time. I understand that I have the right to  inspect all information obtained and/or recorded in the course of the telemedicine visit and may receive copies of available information for a reasonable fee.  I understand that some of the potential risks of receiving the Services via telemedicine include:   Delay or interruption in medical evaluation due to technological equipment failure or disruption;  Information transmitted may not be sufficient (e.g. poor resolution of images) to allow for appropriate medical decision making by the Practitioner; and/or   In rare instances, security protocols could fail, causing a breach of personal health information.  Furthermore, I acknowledge that it is my responsibility to provide information about my medical history, conditions and care that is complete and accurate to the best of my ability. I acknowledge that Practitioner's advice, recommendations, and/or decision may be based on factors not within their control, such as incomplete or inaccurate data provided by me or distortions of diagnostic images or specimens that may result from electronic transmissions. I understand that the practice of medicine is not an exact science and that Practitioner makes no warranties or guarantees regarding treatment outcomes. I acknowledge that I will receive a copy of this consent  concurrently upon execution via email to the email address I last provided but may also request a printed copy by calling the office of [facility].    I understand that my insurance will be billed for this visit.   I have read or had this consent read to me.  I understand the contents of this consent, which adequately explains the benefits and risks of the Services being provided via telemedicine.   I have been provided ample opportunity to ask questions regarding this consent and the Services and have had my questions answered to my satisfaction.  I give my informed consent for the services to be provided through the use of  telemedicine in my medical care  By participating in this telemedicine visit I agree to the above.

## 2018-06-16 NOTE — Progress Notes (Signed)
Virtual Visit via Telephone Note   This visit type was conducted due to national recommendations for restrictions regarding the COVID-19 Pandemic (e.g. social distancing) in an effort to limit this patient's exposure and mitigate transmission in our community.  Due to her co-morbid illnesses, this patient is at least at moderate risk for complications without adequate follow up.  This format is felt to be most appropriate for this patient at this time.  The patient did not have access to video technology/had technical difficulties with video requiring transitioning to audio format only (telephone).  All issues noted in this document were discussed and addressed.  No physical exam could be performed with this format.  Please refer to the patient's chart for her  consent to telehealth for [facility].   Evaluation Performed:  Follow-up visit  Date:  06/16/2018   ID:  Latasha Barber, DOB Sep 15, 1928, MRN [id number]  Patient Location: Home Provider Location: Home  PCP:  Raina Mina., MD  Cardiologist:  No primary care provider on file.  Electrophysiologist:  None   Chief Complaint: Follow-up for atrial fibrillation paroxysmal  History of Present Illness:    Latasha Barber is a 85 y.o. female with medical history of essential hypertension and paroxysmal atrial fibrillation.  She is a very active lady.  She denies any problems at this time and takes care of activities of daily living.  No chest pain orthopnea or PND.  She walks around the house age appropriately.  She has not had any palpitations or any such issues.  She tells me that she is stable on her feet.  At the time of my evaluation, the patient is alert awake oriented and in no distress.  The patient does not have symptoms concerning for COVID-19 infection (fever, chills, cough, or new shortness of breath).    Past Medical History:  Diagnosis Date  . Acquired hypothyroidism 04/14/2015   Last Assessment & Plan:  Update her TSH  for her today and refill her med for her through the mail order  . Anemia 04/14/2015   Last Assessment & Plan:  Update her CBC and ensure that this is stable for her  . Anticoagulant long-term use 11/19/2016  . Atrial fibrillation ([place])   . Dyslipidemia 10/28/2014  . Essential (primary) hypertension 10/28/2014  . Hyperglycemia 04/14/2015   Last Assessment & Plan:  She is doing well with her diet and she is going to have labs drawn for her sugar overall  . Hyperlipemia   . Hypertension   . Leiomyoma of uterus 04/14/2015  . Long-term use of high-risk medication 04/14/2015  . Mixed hyperlipidemia 04/14/2015   Last Assessment & Plan:  Update the lipids for her today and she is going to have this done  . Osteopenia 03/22/2016   2012: -1.2   Past Surgical History:  Procedure Laterality Date  . APPENDECTOMY    . BREAST LUMPECTOMY    . HAND SURGERY    . KNEE SURGERY    . TONSILLECTOMY       Current Meds  Medication Sig  . amiodarone (PACERONE) 200 MG tablet Take 0.5 tablets by mouth daily.  Marland Kitchen apixaban (ELIQUIS) 5 MG TABS tablet Take 5 mg by mouth 2 (two) times daily.  Marland Kitchen b complex vitamins capsule Take 1 capsule by mouth daily.  . Calcium Carbonate-Vitamin D 600-400 MG-UNIT per tablet Take 1 tablet by mouth 2 (two) times daily.  . cetirizine (ZYRTEC) 10 MG tablet Take one to two tablets by mouth one  to two times daily as directed.  ( Maximum of 40 mg/ 4 tablets daily)  . ferrous sulfate 325 (65 FE) MG tablet Take 1 tablet by mouth daily.  Marland Kitchen levothyroxine (SYNTHROID, LEVOTHROID) 112 MCG tablet TAKE 1 TABLET BY MOUTH  DAILY AT 6AM  . lisinopril-hydrochlorothiazide (PRINZIDE,ZESTORETIC) 20-12.5 MG per tablet Take 1 tablet by mouth daily.  Marland Kitchen lovastatin (MEVACOR) 40 MG tablet Take 40 mg by mouth at bedtime.  . triamcinolone cream (KENALOG) 0.1 % Can apply to skin lesions one to two times daily until resolved.     Allergies:   Patient has no known allergies.   Social History   Tobacco Use  .  Smoking status: Never Smoker  . Smokeless tobacco: Never Used  Substance Use Topics  . Alcohol use: No    Alcohol/week: 0.0 standard drinks  . Drug use: No     Family Hx: The patient's family history includes Alzheimer's disease in her sister; Breast cancer in her daughter; COPD in her brother and sister; Cancer in her mother; Multiple myeloma in her sister.  ROS:   Please see the history of present illness.    Again no palpitations syncope or any such issues All other systems reviewed and are negative.   Prior CV studies:   The following studies were reviewed today:  None  Labs/Other Tests and Data Reviewed:    EKG:  No ECG reviewed.  Recent Labs: 01/02/2018: ALT 13; BUN 20; Creatinine, Ser 1.28; Hemoglobin 12.8; Platelets 244; Potassium 3.9; Sodium 141; TSH 4.870   Recent Lipid Panel Lab Results  Component Value Date/Time   CHOL 172 01/02/2018 10:13 AM   TRIG 101 01/02/2018 10:13 AM   HDL 54 01/02/2018 10:13 AM   CHOLHDL 3.2 01/02/2018 10:13 AM   LDLCALC 98 01/02/2018 10:13 AM    Wt Readings from Last 3 Encounters:  06/16/18 130 lb (59 kg)  05/29/18 129 lb 3.2 oz (58.6 kg)  01/02/18 131 lb (59.4 kg)     Objective:    Vital Signs:  Ht 5' 1.5" (1.562 m)   Wt 130 lb (59 kg)   BMI 24.17 kg/m    VITAL SIGNS:  reviewed Patient mentions to me that there is no way she can check her blood pressures  ASSESSMENT & PLAN:    1. Paroxysmal atrial fibrillation:I discussed with the patient atrial fibrillation, disease process. Management and therapy including rate and rhythm control, anticoagulation benefits and potential risks were discussed extensively with the patient. Patient had multiple questions which were answered to patient's satisfaction. 2. She is stable on her feet and walks on a regular basis. 3. I told her that she will need to come back for blood work including Chem-7 liver lipid check when the viral pandemic subsides.Patient will be seen in follow-up  appointment in 6 months or earlier if the patient has any concerns   COVID-19 Education: The signs and symptoms of COVID-19 were discussed with the patient and how to seek care for testing (follow up with PCP or arrange E-visit).  The importance of social distancing was discussed today.  Time:   Today, I have spent 12 minutes with the patient with telehealth technology discussing the above problems.     Medication Adjustments/Labs and Tests Ordered: Current medicines are reviewed at length with the patient today.  Concerns regarding medicines are outlined above.   Tests Ordered: No orders of the defined types were placed in this encounter.   Medication Changes: No orders of the defined types  were placed in this encounter.   Disposition:  Follow up in 4 month(s)  Signed, Jenean Lindau, MD  06/16/2018 10:55 AM    [place]

## 2018-06-16 NOTE — Patient Instructions (Signed)
Medication Instructions:  Your physician recommends that you continue on your current medications as directed. Please refer to the Current Medication list given to you today.  If you need a refill on your cardiac medications before your next appointment, please call your pharmacy.   Lab work: NONE today. Your physician recommends that you come into office between 8-4 except 12-1 M-F for BMP, liver function screening and lipid panel before Sept 2020.You will need to be FASTING for these labs.    If you have labs (blood work) drawn today and your tests are completely normal, you will receive your results only by: Marland Kitchen MyChart Message (if you have MyChart) OR . A paper copy in the mail If you have any lab test that is abnormal or we need to change your treatment, we will call you to review the results.  Testing/Procedures: NONE  Follow-Up: At [facility], you and your health needs are our priority.  As part of our continuing mission to provide you with exceptional heart care, we have created designated Provider Care Teams.  These Care Teams include your primary Cardiologist (physician) and Advanced Practice Providers (APPs -  Physician Assistants and Nurse Practitioners) who all work together to provide you with the care you need, when you need it. You will need a follow up appointment in 6 months.

## 2018-08-31 ENCOUNTER — Encounter: Payer: Self-pay | Admitting: Allergy and Immunology

## 2018-08-31 ENCOUNTER — Other Ambulatory Visit: Payer: Self-pay

## 2018-08-31 ENCOUNTER — Ambulatory Visit: Payer: Medicare Other | Admitting: Allergy and Immunology

## 2018-08-31 VITALS — BP 140/60 | HR 80 | Temp 98.7°F | Resp 20

## 2018-08-31 DIAGNOSIS — L308 Other specified dermatitis: Secondary | ICD-10-CM

## 2018-08-31 DIAGNOSIS — L500 Allergic urticaria: Secondary | ICD-10-CM

## 2018-08-31 DIAGNOSIS — L989 Disorder of the skin and subcutaneous tissue, unspecified: Secondary | ICD-10-CM

## 2018-08-31 MED ORDER — TRIAMCINOLONE ACETONIDE 0.1 % EX CREA: TOPICAL_CREAM | CUTANEOUS | 3 refills | Status: DC

## 2018-08-31 NOTE — Progress Notes (Signed)
[place]   Follow-up Note  Referring Provider: Raina Mina., MD Primary Provider: Raina Mina., MD Date of Office Visit: 08/31/2018     Subjective:   Latasha Barber (DOB: [birth year]) is a 85 y.o. female who returns to the [place] on 08/31/2018 in re-evaluation of the following:  HPI: Latasha Barber returns to this clinic in evaluation of her pruritic disorder and inflammatory dermatosis.  I last saw her in this clinic during her initial evaluation of 29 May 2018.  She has had an excellent response to medical therapy which basically includes the use of high-dose cetirizine and occasional topical triamcinolone.  Her only complaint at this point is that she wakes up at nighttime, not exactly sure what time of the night, and she has some itchiness of her hands and feet for which she will use topical triamcinolone.  She develops this pruritic issue without any dermatitis while utilizing 20 mg of cetirizine in the morning and 10 mg at night.  Once again she has no associated systemic or constitutional symptoms.  Overall she really believes that she has had an excellent response to this form of treatment other than the fact that she is a little itchy at night.  Allergies as of 08/31/2018   No Known Allergies     Medication List      amiodarone 200 MG tablet Commonly known as: PACERONE Take 0.5 tablets by mouth daily.   apixaban 5 MG Tabs tablet Commonly known as: ELIQUIS Take 5 mg by mouth 2 (two) times daily.   b complex vitamins capsule Take 1 capsule by mouth daily.   Calcium Carbonate-Vitamin D 600-400 MG-UNIT tablet Take 1 tablet by mouth 2 (two) times daily.   cetirizine 10 MG tablet Commonly known as: ZYRTEC Take one to two tablets by mouth one to two times daily as directed.  ( Maximum of 40 mg/ 4 tablets daily)   ferrous sulfate 325 (65 FE) MG tablet Take 1 tablet by mouth daily.   levothyroxine 112  MCG tablet Commonly known as: SYNTHROID TAKE 1 TABLET BY MOUTH  DAILY AT 6AM   lisinopril-hydrochlorothiazide 20-12.5 MG tablet Commonly known as: ZESTORETIC Take 1 tablet by mouth daily.   lovastatin 40 MG tablet Commonly known as: MEVACOR Take 40 mg by mouth at bedtime.   triamcinolone cream 0.1 % Commonly known as: KENALOG Can apply to skin lesions one to two times daily until resolved.       Past Medical History:  Diagnosis Date  . Acquired hypothyroidism 04/14/2015   Last Assessment & Plan:  Update her TSH for her today and refill her med for her through the mail order  . Anemia 04/14/2015   Last Assessment & Plan:  Update her CBC and ensure that this is stable for her  . Anticoagulant long-term use 11/19/2016  . Atrial fibrillation ([place])   . Dyslipidemia 10/28/2014  . Essential (primary) hypertension 10/28/2014  . Hyperglycemia 04/14/2015   Last Assessment & Plan:  She is doing well with her diet and she is going to have labs drawn for her sugar overall  . Hyperlipemia   . Hypertension   . Leiomyoma of uterus 04/14/2015  . Long-term use of high-risk medication 04/14/2015  . Mixed hyperlipidemia 04/14/2015   Last Assessment & Plan:  Update the lipids for her today and she is going to have this done  . Osteopenia 03/22/2016   2012: -1.2  Past Surgical History:  Procedure Laterality Date  . APPENDECTOMY    . BREAST LUMPECTOMY    . HAND SURGERY    . KNEE SURGERY    . TONSILLECTOMY      Review of systems negative except as noted in HPI / PMHx or noted below:  Review of Systems  Constitutional: Negative.   HENT: Negative.   Eyes: Negative.   Respiratory: Negative.   Cardiovascular: Negative.   Gastrointestinal: Negative.   Genitourinary: Negative.   Musculoskeletal: Negative.   Skin: Negative.   Neurological: Negative.   Endo/Heme/Allergies: Negative.   Psychiatric/Behavioral: Negative.      Objective:   Vitals:   08/31/18 0942  BP: 140/60  Pulse: 80   Resp: 20  Temp: 98.7 F (37.1 C)          Physical Exam Skin:    Findings: No rash (No obvious dermatitis, excoriation hands and feet).     Diagnostics: none  Assessment and Plan:   1. Inflammatory dermatosis   2. Allergic urticaria     1.  Cetirizine 10 mg -1-2 tablets 1-2 times a day (max=40mg  / day).     2.  Continue topical triamcinolone 1-2 times a day to skin lesions until resolved  3.  Contact clinic if improvement does not continue  Latasha Barber appears to be doing quite well.  I made the recommendation that she increase her cetirizine to 40 mg daily and will make a decision about whether or not we need to have her undergo further treatment or further evaluation pending her response to this approach.  Overall she has really done much better while using 30 mg of cetirizine per day.  Hopefully the additional 10 mg at night will knock out her nocturnal pruritus.  She will keep in contact with me noting her response to this approach.  Allena Katz, MD Allergy / Immunology [place]

## 2018-08-31 NOTE — Patient Instructions (Addendum)
  1.  Cetirizine 10 mg -1-2 tablets 1-2 times a day (max=40mg  / day).    2.  Continue topical triamcinolone 1-2 times a day to skin lesions until resolved  3.  Contact clinic if improvement does not continue

## 2018-09-04 ENCOUNTER — Encounter: Payer: Self-pay | Admitting: Allergy and Immunology

## 2018-09-25 ENCOUNTER — Encounter: Payer: Self-pay | Admitting: Allergy and Immunology

## 2018-09-25 ENCOUNTER — Other Ambulatory Visit: Payer: Self-pay

## 2018-09-25 ENCOUNTER — Ambulatory Visit (INDEPENDENT_AMBULATORY_CARE_PROVIDER_SITE_OTHER): Payer: Medicare Other | Admitting: Allergy and Immunology

## 2018-09-25 VITALS — BP 142/62 | HR 76 | Temp 99.1°F | Resp 18

## 2018-09-25 DIAGNOSIS — L500 Allergic urticaria: Secondary | ICD-10-CM | POA: Diagnosis not present

## 2018-09-25 DIAGNOSIS — L989 Disorder of the skin and subcutaneous tissue, unspecified: Secondary | ICD-10-CM

## 2018-09-25 DIAGNOSIS — L308 Other specified dermatitis: Secondary | ICD-10-CM

## 2018-09-25 NOTE — Patient Instructions (Signed)
  1.  Cetirizine 10 mg -1-2 tablets 1-2 times a day (max=40mg  / day).    2.  Continue topical triamcinolone 1-2 times a day if needed  3. Blood - CBC w/diff, CMP, TSH, T4, T3, TP  4. Further evaluation and treatment?  5. Return to clinic in 8 weeks or earlier if problem

## 2018-09-25 NOTE — Progress Notes (Signed)
[place]   Follow-up Note  Referring Provider: Raina Mina., MD Primary Provider: Raina Mina., MD Date of Office Visit: 09/25/2018  Subjective:   Latasha Barber (DOB: 02/28/1929) is a 85 y.o. female who returns to the [facility] on 09/25/2018 in re-evaluation of the following:  HPI: Latasha Barber returns to this clinic in evaluation of her pruritic disorder with a component of inflammation and urticaria.  I last saw her in this clinic on 31 August 2018.  When I last saw Latasha Barber in this clinic she told me that she was doing better on 30 mg of cetirizine per day and triamcinolone to her arms and shins on most days.  However, she visited with her primary care doctor recently and told her that there is really not much improvement with the therapy that she has been prescribed from this clinic.  Once again, I questioned Latasha Barber about whether or not she has had improvement with cetirizine now at a dose of 40 mg daily and she definitely feels as though she is doing much better.  She still does wake up in the middle the night and sometimes applies topical triamcinolone to her arms and shins as she is itchy.  But overall she does think that the cetirizine at 40 mg daily has helped her itchiness and she has not had any hives.  She has developed a new lesion on her right anterior thigh that came up over the course of the past several weeks and has not progressed over the course of the past 2 weeks or so.  Allergies as of 09/25/2018   No Known Allergies     Medication List      amiodarone 200 MG tablet Commonly known as: PACERONE Take 0.5 tablets by mouth daily.   apixaban 5 MG Tabs tablet Commonly known as: ELIQUIS Take 5 mg by mouth 2 (two) times daily.   b complex vitamins capsule Take 1 capsule by mouth daily.   Calcium Carbonate-Vitamin D 600-400 MG-UNIT tablet Take 1 tablet by mouth 2 (two) times daily.   cetirizine 10 MG  tablet Commonly known as: ZYRTEC Take one to two tablets by mouth one to two times daily as directed.  ( Maximum of 40 mg/ 4 tablets daily)   ferrous sulfate 325 (65 FE) MG tablet Take 1 tablet by mouth daily.   levothyroxine 112 MCG tablet Commonly known as: SYNTHROID TAKE 1 TABLET BY MOUTH  DAILY AT 6AM   lisinopril-hydrochlorothiazide 20-12.5 MG tablet Commonly known as: ZESTORETIC Take 1 tablet by mouth daily.   lovastatin 40 MG tablet Commonly known as: MEVACOR Take 40 mg by mouth at bedtime.   triamcinolone cream 0.1 % Commonly known as: KENALOG Can apply to skin lesions one to two times daily until resolved.       Past Medical History:  Diagnosis Date  . Acquired hypothyroidism 04/14/2015   Last Assessment & Plan:  Update her TSH for her today and refill her med for her through the mail order  . Anemia 04/14/2015   Last Assessment & Plan:  Update her CBC and ensure that this is stable for her  . Anticoagulant long-term use 11/19/2016  . Atrial fibrillation ([place])   . Dyslipidemia 10/28/2014  . Essential (primary) hypertension 10/28/2014  . Hyperglycemia 04/14/2015   Last Assessment & Plan:  She is doing well with her diet and she is going to have labs drawn for her sugar overall  .  Hyperlipemia   . Hypertension   . Leiomyoma of uterus 04/14/2015  . Long-term use of high-risk medication 04/14/2015  . Mixed hyperlipidemia 04/14/2015   Last Assessment & Plan:  Update the lipids for her today and she is going to have this done  . Osteopenia 03/22/2016   2012: -1.2    Past Surgical History:  Procedure Laterality Date  . APPENDECTOMY    . BREAST LUMPECTOMY    . HAND SURGERY    . KNEE SURGERY    . TONSILLECTOMY      Review of systems negative except as noted in HPI / PMHx or noted below:  Review of Systems  Constitutional: Negative.   HENT: Negative.   Eyes: Negative.   Respiratory: Negative.   Cardiovascular: Negative.   Gastrointestinal: Negative.    Genitourinary: Negative.   Musculoskeletal: Negative.   Skin: Negative.   Neurological: Negative.   Endo/Heme/Allergies: Negative.   Psychiatric/Behavioral: Negative.      Objective:   Vitals:   09/25/18 1526  BP: (!) 142/62  Pulse: 76  Resp: 18  Temp: 99.1 F (37.3 C)  SpO2: 97%          Physical Exam Skin:    Findings: Rash (Multiple hyperpigmented macules involving forearms.  Large seborrheic keratosis-like lesions affecting right anterior thigh.) present.     Diagnostics: none  Assessment and Plan:   1. Inflammatory dermatosis   2. Allergic urticaria     1.  Cetirizine 10 mg -1-2 tablets 1-2 times a day (max=40mg  / day).    2.  Continue topical triamcinolone 1-2 times a day if needed  3. Blood - CBC w/diff, CMP, TSH, T4, T3, TP  4. Further evaluation and treatment?  5. 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  She is on several different medications that certainly could be causing some problem with his immunological hyperactivity.  I will contact her with the results of her blood test once they are available for review and will make a decision about further evaluation and treatment based upon his results and her response to therapy noted above.  Allena Katz, MD Allergy / Immunology [place]

## 2018-09-26 ENCOUNTER — Encounter: Payer: Self-pay | Admitting: Allergy and Immunology

## 2018-09-28 LAB — COMPREHENSIVE METABOLIC PANEL
ALT: 14 IU/L (ref 0–32)
AST: 22 IU/L (ref 0–40)
Albumin/Globulin Ratio: 2 (ref 1.2–2.2)
Albumin: 4.3 g/dL (ref 3.6–4.6)
Alkaline Phosphatase: 91 IU/L (ref 39–117)
BUN/Creatinine Ratio: 13 (ref 12–28)
BUN: 17 mg/dL (ref 8–27)
Bilirubin Total: 0.7 mg/dL (ref 0.0–1.2)
CO2: 24 mmol/L (ref 20–29)
Calcium: 9.1 mg/dL (ref 8.7–10.3)
Chloride: 103 mmol/L (ref 96–106)
Creatinine, Ser: 1.3 mg/dL — ABNORMAL HIGH (ref 0.57–1.00)
GFR calc Af Amer: 42 mL/min/{1.73_m2} — ABNORMAL LOW (ref >59)
GFR calc non Af Amer: 37 mL/min/{1.73_m2} — ABNORMAL LOW (ref >59)
Globulin, Total: 2.1 g/dL (ref 1.5–4.5)
Glucose: 101 mg/dL — ABNORMAL HIGH (ref 65–99)
Potassium: 4.3 mmol/L (ref 3.5–5.2)
Sodium: 138 mmol/L (ref 134–144)
Total Protein: 6.4 g/dL (ref 6.0–8.5)

## 2018-09-28 LAB — CBC WITH DIFFERENTIAL/PLATELET
Basophils Absolute: 0 10*3/uL (ref 0.0–0.2)
Basos: 1 %
EOS (ABSOLUTE): 0.4 10*3/uL (ref 0.0–0.4)
Eos: 5 %
Hematocrit: 37.6 % (ref 34.0–46.6)
Hemoglobin: 13 g/dL (ref 11.1–15.9)
Lymphocytes Absolute: 1.7 10*3/uL (ref 0.7–3.1)
Lymphs: 22 %
MCH: 30.2 pg (ref 26.6–33.0)
MCHC: 34.6 g/dL (ref 31.5–35.7)
MCV: 87 fL (ref 79–97)
Monocytes Absolute: 0.8 10*3/uL (ref 0.1–0.9)
Monocytes: 10 %
Neutrophils Absolute: 4.8 10*3/uL (ref 1.4–7.0)
Neutrophils: 62 %
Platelets: 230 10*3/uL (ref 150–450)
RBC: 4.31 x10E6/uL (ref 3.77–5.28)
RDW: 13.7 % (ref 11.7–15.4)
WBC: 7.7 10*3/uL (ref 3.4–10.8)

## 2018-09-28 LAB — T3, FREE: T3, Free: 1.7 pg/mL — ABNORMAL LOW (ref 2.0–4.4)

## 2018-09-28 LAB — THYROID PEROXIDASE ANTIBODY: Thyroperoxidase Ab SerPl-aCnc: <6 IU/mL (ref 0–34)

## 2018-09-28 LAB — TSH+FREE T4
Free T4: 1.71 ng/dL (ref 0.82–1.77)
TSH: 3.57 u[IU]/mL (ref 0.450–4.500)

## 2018-11-20 ENCOUNTER — Ambulatory Visit: Payer: Medicare Other | Admitting: Allergy and Immunology

## 2018-11-20 ENCOUNTER — Encounter: Payer: Self-pay | Admitting: Allergy and Immunology

## 2018-11-20 ENCOUNTER — Other Ambulatory Visit: Payer: Self-pay

## 2018-11-20 VITALS — BP 158/60 | HR 74 | Temp 98.2°F | Resp 18

## 2018-11-20 DIAGNOSIS — L299 Pruritus, unspecified: Secondary | ICD-10-CM

## 2018-11-20 NOTE — Progress Notes (Signed)
[place]   Follow-up Note  Referring Provider: Raina Mina., MD Primary Provider: Raina Mina., MD Date of Office Visit: 11/20/2018  Subjective:   Latasha Barber (DOB: 01-Sep-1928) is a 85 y.o. female who returns to the [facility] on 11/20/2018 in re-evaluation of the following:  HPI: Melaya returns to this clinic in evaluation of her pruritic disorder with inflammation and urticaria.  I last saw her in this clinic on 25 September 2018 at which point time she was doing better.  She has resolved her pruritus and has no urticaria and no inflamed skin and feels great.  She has tapered off all of her medications used for this condition over the course of the past several weeks.  Allergies as of 11/20/2018   No Known Allergies     Medication List    amiodarone 200 MG tablet Commonly known as: PACERONE Take 0.5 tablets by mouth daily.   apixaban 5 MG Tabs tablet Commonly known as: ELIQUIS Take 5 mg by mouth 2 (two) times daily.   b complex vitamins capsule Take 1 capsule by mouth daily.   Calcium Carbonate-Vitamin D 600-400 MG-UNIT tablet Take 1 tablet by mouth 2 (two) times daily.   ferrous sulfate 325 (65 FE) MG tablet Take 1 tablet by mouth daily.   levothyroxine 112 MCG tablet Commonly known as: SYNTHROID TAKE 1 TABLET BY MOUTH  DAILY AT 6AM   lisinopril-hydrochlorothiazide 20-12.5 MG tablet Commonly known as: ZESTORETIC Take 1 tablet by mouth daily.   lovastatin 40 MG tablet Commonly known as: MEVACOR Take 40 mg by mouth at bedtime.       Past Medical History:  Diagnosis Date  . Acquired hypothyroidism 04/14/2015   Last Assessment & Plan:  Update her TSH for her today and refill her med for her through the mail order  . Anemia 04/14/2015   Last Assessment & Plan:  Update her CBC and ensure that this is stable for her  . Anticoagulant long-term use 11/19/2016  . Atrial fibrillation ([place])   .  Dyslipidemia 10/28/2014  . Essential (primary) hypertension 10/28/2014  . Hyperglycemia 04/14/2015   Last Assessment & Plan:  She is doing well with her diet and she is going to have labs drawn for her sugar overall  . Hyperlipemia   . Hypertension   . Leiomyoma of uterus 04/14/2015  . Long-term use of high-risk medication 04/14/2015  . Mixed hyperlipidemia 04/14/2015   Last Assessment & Plan:  Update the lipids for her today and she is going to have this done  . Osteopenia 03/22/2016   2012: -1.2    Past Surgical History:  Procedure Laterality Date  . APPENDECTOMY    . BREAST LUMPECTOMY    . HAND SURGERY    . KNEE SURGERY    . TONSILLECTOMY      Review of systems negative except as noted in HPI / PMHx or noted below:  Review of Systems  Constitutional: Negative.   HENT: Negative.   Eyes: Negative.   Respiratory: Negative.   Cardiovascular: Negative.   Gastrointestinal: Negative.   Genitourinary: Negative.   Musculoskeletal: Negative.   Skin: Negative.   Neurological: Negative.   Endo/Heme/Allergies: Negative.   Psychiatric/Behavioral: Negative.      Objective:   Vitals:   11/20/18 1055  BP: (!) 158/60  Pulse: 74  Resp: 18  Temp: 98.2 F (36.8 C)  SpO2: 98%  Physical Exam-deferred  Diagnostics:   Results of blood tests obtained 26 September 2018 identifies creatinine 1.30, AST 20 2U/L, ALT 14 U/L, WBC 7.7, absolute eosinophil 400, absolute lymphocyte 1700, hemoglobin 13.0, platelet 230, TSH 3.570 IU/mL, T4 1.71 NG/DL, thyroid peroxidase antibody less than 6U/mL  Assessment and Plan:   1. 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  It is still not very clear what was giving rise to this condition for several months but it did appear to occur after being injected with collagenase in her right pinky.  At this point in time we will assume that she had a immune  activation as a result of exposure to this agent and she can contact me in the future should she develop any new problems as she moves forward.  Allena Katz, MD Allergy / Immunology [place]

## 2018-11-21 ENCOUNTER — Encounter: Payer: Self-pay | Admitting: Allergy and Immunology

## 2018-12-05 ENCOUNTER — Ambulatory Visit: Payer: Medicare Other | Admitting: Cardiology

## 2018-12-06 ENCOUNTER — Ambulatory Visit: Payer: Medicare Other | Admitting: Cardiology

## 2018-12-06 ENCOUNTER — Other Ambulatory Visit: Payer: Self-pay

## 2018-12-06 ENCOUNTER — Encounter: Payer: Self-pay | Admitting: Cardiology

## 2018-12-06 VITALS — BP 156/68 | HR 71 | Ht 61.5 in | Wt 133.0 lb

## 2018-12-06 DIAGNOSIS — Z1329 Encounter for screening for other suspected endocrine disorder: Secondary | ICD-10-CM

## 2018-12-06 DIAGNOSIS — Z7901 Long term (current) use of anticoagulants: Secondary | ICD-10-CM

## 2018-12-06 DIAGNOSIS — E782 Mixed hyperlipidemia: Secondary | ICD-10-CM | POA: Diagnosis not present

## 2018-12-06 DIAGNOSIS — Z79899 Other long term (current) drug therapy: Secondary | ICD-10-CM

## 2018-12-06 DIAGNOSIS — I480 Paroxysmal atrial fibrillation: Secondary | ICD-10-CM | POA: Diagnosis not present

## 2018-12-06 DIAGNOSIS — I10 Essential (primary) hypertension: Secondary | ICD-10-CM

## 2018-12-06 DIAGNOSIS — R739 Hyperglycemia, unspecified: Secondary | ICD-10-CM

## 2018-12-06 DIAGNOSIS — E785 Hyperlipidemia, unspecified: Secondary | ICD-10-CM

## 2018-12-06 NOTE — Addendum Note (Signed)
Addended by: Beckey Rutter on: 12/06/2018 09:18 AM   Modules accepted: Orders

## 2018-12-06 NOTE — Progress Notes (Signed)
Cardiology Office Note:    Date:  12/06/2018   ID:  Latasha Barber, DOB 05-18-1928, MRN [id number]  PCP:  Raina Mina., MD  Cardiologist:  Jenean Lindau, MD   Referring MD: Raina Mina., MD    ASSESSMENT:    1. AF (paroxysmal atrial fibrillation) ([place])   2. Essential (primary) hypertension   3. Mixed hyperlipidemia   4. Long-term use of high-risk medication   5. Hyperglycemia   6. Dyslipidemia   7. Anticoagulant long-term use    PLAN:    In order of problems listed above:  1. Paroxysmal atrial fibrillation:I discussed with the patient atrial fibrillation, disease process. Management and therapy including rate and rhythm control, anticoagulation benefits and potential risks were discussed extensively with the patient. Patient had multiple questions which were answered to patient's satisfaction.  Patient is on amiodarone therapy and benefits and potential risks explained at length and she vocalized understanding. 2. Mixed dyslipidemia: She will have blood work today including fasting lipids.  Diet was discussed 3. Essential hypertension: Blood pressure stable and dietary issues such as salt intake issues were discussed at length. 4. Patient will be seen in follow-up appointment in 6 months or earlier if the patient has any concerns    Medication Adjustments/Labs and Tests Ordered: Current medicines are reviewed at length with the patient today.  Concerns regarding medicines are outlined above.  No orders of the defined types were placed in this encounter.  No orders of the defined types were placed in this encounter.    Chief Complaint  Patient presents with  . Follow-up     History of Present Illness:    Latasha Barber is a 85 y.o. female.  Patient has past medical history of paroxysmal atrial fibrillation essential hypertension and mixed dyslipidemia.  She denies any problems at this time and takes care of activities of daily living.  No chest pain orthopnea  or PND.  At the time of my evaluation, the patient is alert awake oriented and in no distress.  She is ambulating age appropriately and walks on a regular basis.  Past Medical History:  Diagnosis Date  . Acquired hypothyroidism 04/14/2015   Last Assessment & Plan:  Update her TSH for her today and refill her med for her through the mail order  . Anemia 04/14/2015   Last Assessment & Plan:  Update her CBC and ensure that this is stable for her  . Anticoagulant long-term use 11/19/2016  . Atrial fibrillation ([place])   . Dyslipidemia 10/28/2014  . Essential (primary) hypertension 10/28/2014  . Hyperglycemia 04/14/2015   Last Assessment & Plan:  She is doing well with her diet and she is going to have labs drawn for her sugar overall  . Hyperlipemia   . Hypertension   . Leiomyoma of uterus 04/14/2015  . Long-term use of high-risk medication 04/14/2015  . Mixed hyperlipidemia 04/14/2015   Last Assessment & Plan:  Update the lipids for her today and she is going to have this done  . Osteopenia 03/22/2016   2012: -1.2    Past Surgical History:  Procedure Laterality Date  . APPENDECTOMY    . BREAST LUMPECTOMY    . HAND SURGERY    . KNEE SURGERY    . TONSILLECTOMY      Current Medications: Current Meds  Medication Sig  . amiodarone (PACERONE) 200 MG tablet Take 0.5 tablets by mouth daily.  Marland Kitchen apixaban (ELIQUIS) 5 MG TABS tablet Take 5 mg by mouth 2 (  two) times daily.  Marland Kitchen b complex vitamins capsule Take 1 capsule by mouth daily.  . Calcium Carbonate-Vitamin D 600-400 MG-UNIT per tablet Take 1 tablet by mouth 2 (two) times daily.  . ferrous sulfate 325 (65 FE) MG tablet Take 1 tablet by mouth daily.  Marland Kitchen levothyroxine (SYNTHROID, LEVOTHROID) 112 MCG tablet TAKE 1 TABLET BY MOUTH  DAILY AT 6AM  . lisinopril-hydrochlorothiazide (PRINZIDE,ZESTORETIC) 20-12.5 MG per tablet Take 1 tablet by mouth daily.  Marland Kitchen lovastatin (MEVACOR) 40 MG tablet Take 40 mg by mouth at bedtime.     Allergies:   Patient has no  known allergies.   Social History   Socioeconomic History  . Marital status: Widowed    Spouse name: Not on file  . Number of children: Not on file  . Years of education: Not on file  . Highest education level: Not on file  Occupational History  . Occupation: Part Time Dealer    Comment: [employer]  Social Needs  . Financial resource strain: Not on file  . Food insecurity    Worry: Not on file    Inability: Not on file  . Transportation needs    Medical: Not on file    Non-medical: Not on file  Tobacco Use  . Smoking status: Never Smoker  . Smokeless tobacco: Never Used  Substance and Sexual Activity  . Alcohol use: No    Alcohol/week: 0.0 standard drinks  . Drug use: No  . Sexual activity: Not on file  Lifestyle  . Physical activity    Days per week: Not on file    Minutes per session: Not on file  . Stress: Not on file  Relationships  . Social Herbalist on phone: Not on file    Gets together: Not on file    Attends religious service: Not on file    Active member of club or organization: Not on file    Attends meetings of clubs or organizations: Not on file    Relationship status: Not on file  Other Topics Concern  . Not on file  Social History Narrative  . Not on file     Family History: The patient's family history includes Alzheimer's disease in her sister; Breast cancer in her daughter; COPD in her brother and sister; Cancer in her mother; Multiple myeloma in her sister.  ROS:   Please see the history of present illness.    All other systems reviewed and are negative.  EKGs/Labs/Other Studies Reviewed:    The following studies were reviewed today: EKG reveals sinus rhythm and nonspecific ST-T changes.   Recent Labs: 09/26/2018: ALT 14; BUN 17; Creatinine, Ser 1.30; Hemoglobin 13.0; Platelets 230; Potassium 4.3; Sodium 138; TSH 3.570  Recent Lipid Panel    Component Value Date/Time   CHOL 172 01/02/2018 1013   TRIG 101  01/02/2018 1013   HDL 54 01/02/2018 1013   CHOLHDL 3.2 01/02/2018 1013   LDLCALC 98 01/02/2018 1013    Physical Exam:    VS:  BP (!) 156/68   Pulse 71   Ht 5' 1.5" (1.562 m)   Wt 133 lb (60.3 kg)   SpO2 (!) 8%   BMI 24.72 kg/m     Wt Readings from Last 3 Encounters:  12/06/18 133 lb (60.3 kg)  06/16/18 130 lb (59 kg)  05/29/18 129 lb 3.2 oz (58.6 kg)     GEN: Patient is in no acute distress HEENT: Normal NECK: No JVD; No carotid  bruits LYMPHATICS: No lymphadenopathy CARDIAC: Hear sounds regular, 2/6 systolic murmur at the apex. RESPIRATORY:  Clear to auscultation without rales, wheezing or rhonchi  ABDOMEN: Soft, non-tender, non-distended MUSCULOSKELETAL:  No edema; No deformity  SKIN: Warm and dry NEUROLOGIC:  Alert and oriented x 3 PSYCHIATRIC:  Normal affect   Signed, Jenean Lindau, MD  12/06/2018 8:59 AM    [facility]

## 2018-12-06 NOTE — Patient Instructions (Signed)
Medication Instructions:  Your physician recommends that you continue on your current medications as directed. Please refer to the Current Medication list given to you today.  If you need a refill on your cardiac medications before your next appointment, please call your pharmacy.   Lab work: Your physician recommends that you have a BMP, CBC, TSH, hepatic and lipid drawn today  If you have labs (blood work) drawn today and your tests are completely normal, you will receive your results only by: Marland Kitchen MyChart Message (if you have MyChart) OR . A paper copy in the mail If you have any lab test that is abnormal or we need to change your treatment, we will call you to review the results.  Testing/Procedures: You had an EKG performed today  Follow-Up: At [facility], you and your health needs are our priority.  As part of our continuing mission to provide you with exceptional heart care, we have created designated Provider Care Teams.  These Care Teams include your primary Cardiologist (physician) and Advanced Practice Providers (APPs -  Physician Assistants and Nurse Practitioners) who all work together to provide you with the care you need, when you need it. You will need a follow up appointment in 6 months.

## 2018-12-07 LAB — HEPATIC FUNCTION PANEL
ALT: 13 IU/L (ref 0–32)
AST: 25 IU/L (ref 0–40)
Albumin: 4.3 g/dL (ref 3.6–4.6)
Alkaline Phosphatase: 94 IU/L (ref 39–117)
Bilirubin Total: 0.4 mg/dL (ref 0.0–1.2)
Bilirubin, Direct: 0.11 mg/dL (ref 0.00–0.40)
Total Protein: 6.9 g/dL (ref 6.0–8.5)

## 2018-12-07 LAB — BASIC METABOLIC PANEL
BUN/Creatinine Ratio: 12 (ref 12–28)
BUN: 13 mg/dL (ref 8–27)
CO2: 25 mmol/L (ref 20–29)
Calcium: 9.2 mg/dL (ref 8.7–10.3)
Chloride: 102 mmol/L (ref 96–106)
Creatinine, Ser: 1.12 mg/dL — ABNORMAL HIGH (ref 0.57–1.00)
GFR calc Af Amer: 51 mL/min/{1.73_m2} — ABNORMAL LOW (ref >59)
GFR calc non Af Amer: 44 mL/min/{1.73_m2} — ABNORMAL LOW (ref >59)
Glucose: 88 mg/dL (ref 65–99)
Potassium: 4.3 mmol/L (ref 3.5–5.2)
Sodium: 140 mmol/L (ref 134–144)

## 2018-12-07 LAB — LIPID PANEL
Chol/HDL Ratio: 3.6 ratio (ref 0.0–4.4)
Cholesterol, Total: 173 mg/dL (ref 100–199)
HDL: 48 mg/dL (ref >39)
LDL Chol Calc (NIH): 96 mg/dL (ref 0–99)
Triglycerides: 170 mg/dL — ABNORMAL HIGH (ref 0–149)
VLDL Cholesterol Cal: 29 mg/dL (ref 5–40)

## 2018-12-07 LAB — CBC
Hematocrit: 39.8 % (ref 34.0–46.6)
Hemoglobin: 13.2 g/dL (ref 11.1–15.9)
MCH: 29.3 pg (ref 26.6–33.0)
MCHC: 33.2 g/dL (ref 31.5–35.7)
MCV: 88 fL (ref 79–97)
Platelets: 250 10*3/uL (ref 150–450)
RBC: 4.5 x10E6/uL (ref 3.77–5.28)
RDW: 12.7 % (ref 11.7–15.4)
WBC: 8.3 10*3/uL (ref 3.4–10.8)

## 2018-12-07 LAB — TSH: TSH: 6.13 u[IU]/mL — ABNORMAL HIGH (ref 0.450–4.500)

## 2018-12-13 ENCOUNTER — Telehealth: Payer: Self-pay

## 2018-12-13 NOTE — Telephone Encounter (Signed)
Results relayed, copy sent to Dr.Grisso.  

## 2018-12-13 NOTE — Telephone Encounter (Signed)
-----   Message from Jenean Lindau, MD sent at 12/07/2018 12:06 PM EDT ----- The results of the study is unremarkable. Please inform patient. I will discuss in detail at next appointment. Cc  primary care/referring physician Jenean Lindau, MD 12/07/2018 12:06 PM

## 2019-03-30 ENCOUNTER — Other Ambulatory Visit: Payer: Self-pay

## 2019-03-30 ENCOUNTER — Encounter: Payer: Self-pay | Admitting: Cardiology

## 2019-03-30 ENCOUNTER — Ambulatory Visit: Payer: Medicare Other | Admitting: Cardiology

## 2019-03-30 VITALS — BP 168/70 | HR 84 | Ht 61.5 in | Wt 133.0 lb

## 2019-03-30 DIAGNOSIS — I480 Paroxysmal atrial fibrillation: Secondary | ICD-10-CM

## 2019-03-30 DIAGNOSIS — I10 Essential (primary) hypertension: Secondary | ICD-10-CM | POA: Diagnosis not present

## 2019-03-30 DIAGNOSIS — E782 Mixed hyperlipidemia: Secondary | ICD-10-CM | POA: Diagnosis not present

## 2019-03-30 NOTE — Progress Notes (Signed)
Cardiology Office Note:    Date:  03/30/2019   ID:  [name], DOB 08/08/1928, MRN [id number]  PCP:  Raina Mina., MD  Cardiologist:  Jenean Lindau, MD   Referring MD: Gardiner Rhyme, MD    ASSESSMENT:    1. AF (paroxysmal atrial fibrillation) ([place])   2. Essential (primary) hypertension   3. Mixed hyperlipidemia    PLAN:    In order of problems listed above:  1. Primary prevention stressed with the patient.  Importance of compliance with diet and medication stressed and she vocalized understanding. 2. Paroxysmal atrial fibrillation:I discussed with the patient atrial fibrillation, disease process. Management and therapy including rate and rhythm control, anticoagulation benefits and potential risks were discussed extensively with the patient. Patient had multiple questions which were answered to patient's satisfaction. 3. Essential hypertension: Blood pressure is stable.  She has an element of whitecoat hypertension.  Her blood pressure at home is fine. 4. Mixed dyslipidemia: Diet was emphasized and she will be back in the next few days for complete blood work including fasting lipids 5. Patient will be seen in follow-up appointment in 6 months or earlier if the patient has any concerns    Medication Adjustments/Labs and Tests Ordered: Current medicines are reviewed at length with the patient today.  Concerns regarding medicines are outlined above.  No orders of the defined types were placed in this encounter.  No orders of the defined types were placed in this encounter.    No chief complaint on file.    History of Present Illness:    Latasha Barber is a 86 y.o. female.  Patient has past medical history of paroxysmal atrial fibrillation and essential hypertension.  She denies any problems at this time and takes care of activities of daily living.  No chest pain orthopnea or PND.  She is an active lady.  She just retired last month.  She walks on a regular  basis.  At the time of my evaluation, the patient is alert awake oriented and in no distress.  Past Medical History:  Diagnosis Date  . Acquired hypothyroidism 04/14/2015   Last Assessment & Plan:  Update her TSH for her today and refill her med for her through the mail order  . Anemia 04/14/2015   Last Assessment & Plan:  Update her CBC and ensure that this is stable for her  . Anticoagulant long-term use 11/19/2016  . Atrial fibrillation ([place])   . Dyslipidemia 10/28/2014  . Essential (primary) hypertension 10/28/2014  . Hyperglycemia 04/14/2015   Last Assessment & Plan:  She is doing well with her diet and she is going to have labs drawn for her sugar overall  . Hyperlipemia   . Hypertension   . Leiomyoma of uterus 04/14/2015  . Long-term use of high-risk medication 04/14/2015  . Mixed hyperlipidemia 04/14/2015   Last Assessment & Plan:  Update the lipids for her today and she is going to have this done  . Osteopenia 03/22/2016   2012: -1.2    Past Surgical History:  Procedure Laterality Date  . APPENDECTOMY    . BREAST LUMPECTOMY    . HAND SURGERY    . KNEE SURGERY    . TONSILLECTOMY      Current Medications: Current Meds  Medication Sig  . amiodarone (PACERONE) 200 MG tablet Take 0.5 tablets by mouth daily.  Marland Kitchen apixaban (ELIQUIS) 5 MG TABS tablet Take 5 mg by mouth 2 (two) times daily.  Marland Kitchen b complex vitamins capsule  Take 1 capsule by mouth daily.  . Calcium Carbonate-Vitamin D 600-400 MG-UNIT per tablet Take 1 tablet by mouth 2 (two) times daily.  . ferrous sulfate 325 (65 FE) MG tablet Take 1 tablet by mouth daily.  Marland Kitchen levothyroxine (SYNTHROID, LEVOTHROID) 112 MCG tablet TAKE 1 TABLET BY MOUTH  DAILY AT 6AM  . lisinopril-hydrochlorothiazide (PRINZIDE,ZESTORETIC) 20-12.5 MG per tablet Take 1 tablet by mouth daily.  Marland Kitchen lovastatin (MEVACOR) 40 MG tablet Take 40 mg by mouth at bedtime.     Allergies:   Patient has no known allergies.   Social History   Socioeconomic History  .  Marital status: Widowed    Spouse name: Not on file  . Number of children: Not on file  . Years of education: Not on file  . Highest education level: Not on file  Occupational History  . Occupation: Part Time [employer]    Comment: [employer]  Tobacco Use  . Smoking status: Never Smoker  . Smokeless tobacco: Never Used  Substance and Sexual Activity  . Alcohol use: No    Alcohol/week: 0.0 standard drinks  . Drug use: No  . Sexual activity: Not on file  Other Topics Concern  . Not on file  Social History Narrative  . Not on file   Social Determinants of Health   Financial Resource Strain:   . Difficulty of Paying Living Expenses: Not on file  Food Insecurity:   . Worried About Charity fundraiser in the Last Year: Not on file  . Ran Out of Food in the Last Year: Not on file  Transportation Needs:   . Lack of Transportation (Medical): Not on file  . Lack of Transportation (Non-Medical): Not on file  Physical Activity:   . Days of Exercise per Week: Not on file  . Minutes of Exercise per Session: Not on file  Stress:   . Feeling of Stress : Not on file  Social Connections:   . Frequency of Communication with Friends and Family: Not on file  . Frequency of Social Gatherings with Friends and Family: Not on file  . Attends Religious Services: Not on file  . Active Member of Clubs or Organizations: Not on file  . Attends Archivist Meetings: Not on file  . Marital Status: Not on file     Family History: The patient's family history includes Alzheimer's disease in her sister; Breast cancer in her daughter; COPD in her brother and sister; Cancer in her mother; Multiple myeloma in her sister.  ROS:   Please see the history of present illness.    All other systems reviewed and are negative.  EKGs/Labs/Other Studies Reviewed:    The following studies were reviewed today: I discussed my findings with the patient at length   Recent Labs: 12/06/2018: ALT  13; BUN 13; Creatinine, Ser 1.12; Hemoglobin 13.2; Platelets 250; Potassium 4.3; Sodium 140; TSH 6.130  Recent Lipid Panel    Component Value Date/Time   CHOL 173 12/06/2018 0922   TRIG 170 (H) 12/06/2018 0922   HDL 48 12/06/2018 0922   CHOLHDL 3.6 12/06/2018 0922   LDLCALC 96 12/06/2018 0922    Physical Exam:    VS:  BP (!) 168/70   Pulse 84   Ht 5' 1.5" (1.562 m)   Wt 133 lb (60.3 kg)   SpO2 99%   BMI 24.72 kg/m     Wt Readings from Last 3 Encounters:  03/30/19 133 lb (60.3 kg)  12/06/18 133 lb (  60.3 kg)  06/16/18 130 lb (59 kg)     GEN: Patient is in no acute distress HEENT: Normal NECK: No JVD; No carotid bruits LYMPHATICS: No lymphadenopathy CARDIAC: Hear sounds regular, 2/6 systolic murmur at the apex. RESPIRATORY:  Clear to auscultation without rales, wheezing or rhonchi  ABDOMEN: Soft, non-tender, non-distended MUSCULOSKELETAL:  No edema; No deformity  SKIN: Warm and dry NEUROLOGIC:  Alert and oriented x 3 PSYCHIATRIC:  Normal affect   Signed, Jenean Lindau, MD  03/30/2019 2:04 PM    [place]

## 2019-03-30 NOTE — Patient Instructions (Signed)
Medication Instructions:  No change *If you need a refill on your cardiac medications before your next appointment, please call your pharmacy*  Lab Work: BMET CBC TSH LFT LIPID If you have labs (blood work) drawn today and your tests are completely normal, you will receive your results only by: Marland Kitchen MyChart Message (if you have MyChart) OR . A paper copy in the mail If you have any lab test that is abnormal or we need to change your treatment, we will call you to review the results.  Testing/Procedures: none  Follow-Up: At [facility], you and your health needs are our priority.  As part of our continuing mission to provide you with exceptional heart care, we have created designated Provider Care Teams.  These Care Teams include your primary Cardiologist (physician) and Advanced Practice Providers (APPs -  Physician Assistants and Nurse Practitioners) who all work together to provide you with the care you need, when you need it.  Your next appointment:   6 month(s)  The format for your next appointment:   In Person  Provider:   Jyl Heinz, MD  Other Instructions  Stay Well

## 2019-03-30 NOTE — Addendum Note (Signed)
Addended by: Amado Coe on: 03/30/2019 02:14 PM   Modules accepted: Orders

## 2019-04-03 LAB — LIPID PANEL
Chol/HDL Ratio: 3.2 ratio (ref 0.0–4.4)
Cholesterol, Total: 172 mg/dL (ref 100–199)
HDL: 54 mg/dL (ref >39)
LDL Chol Calc (NIH): 97 mg/dL (ref 0–99)
Triglycerides: 120 mg/dL (ref 0–149)
VLDL Cholesterol Cal: 21 mg/dL (ref 5–40)

## 2019-04-03 LAB — COMPREHENSIVE METABOLIC PANEL
ALT: 12 IU/L (ref 0–32)
AST: 17 IU/L (ref 0–40)
Albumin/Globulin Ratio: 1.7 (ref 1.2–2.2)
Albumin: 4.3 g/dL (ref 3.6–4.6)
Alkaline Phosphatase: 91 IU/L (ref 39–117)
BUN/Creatinine Ratio: 17 (ref 12–28)
BUN: 19 mg/dL (ref 8–27)
Bilirubin Total: 0.5 mg/dL (ref 0.0–1.2)
CO2: 25 mmol/L (ref 20–29)
Calcium: 8.9 mg/dL (ref 8.7–10.3)
Chloride: 102 mmol/L (ref 96–106)
Creatinine, Ser: 1.12 mg/dL — ABNORMAL HIGH (ref 0.57–1.00)
GFR calc Af Amer: 51 mL/min/{1.73_m2} — ABNORMAL LOW (ref >59)
GFR calc non Af Amer: 44 mL/min/{1.73_m2} — ABNORMAL LOW (ref >59)
Globulin, Total: 2.5 g/dL (ref 1.5–4.5)
Glucose: 92 mg/dL (ref 65–99)
Potassium: 4.3 mmol/L (ref 3.5–5.2)
Sodium: 141 mmol/L (ref 134–144)
Total Protein: 6.8 g/dL (ref 6.0–8.5)

## 2019-04-03 LAB — CBC WITH DIFFERENTIAL/PLATELET
Basophils Absolute: 0.1 10*3/uL (ref 0.0–0.2)
Basos: 1 %
EOS (ABSOLUTE): 0.2 10*3/uL (ref 0.0–0.4)
Eos: 3 %
Hematocrit: 39.3 % (ref 34.0–46.6)
Hemoglobin: 12.8 g/dL (ref 11.1–15.9)
Immature Grans (Abs): 0 10*3/uL (ref 0.0–0.1)
Immature Granulocytes: 0 %
Lymphocytes Absolute: 2 10*3/uL (ref 0.7–3.1)
Lymphs: 25 %
MCH: 28.7 pg (ref 26.6–33.0)
MCHC: 32.6 g/dL (ref 31.5–35.7)
MCV: 88 fL (ref 79–97)
Monocytes Absolute: 0.7 10*3/uL (ref 0.1–0.9)
Monocytes: 9 %
Neutrophils Absolute: 4.9 10*3/uL (ref 1.4–7.0)
Neutrophils: 62 %
Platelets: 230 10*3/uL (ref 150–450)
RBC: 4.46 x10E6/uL (ref 3.77–5.28)
RDW: 12.8 % (ref 11.7–15.4)
WBC: 7.9 10*3/uL (ref 3.4–10.8)

## 2019-04-03 LAB — HEPATIC FUNCTION PANEL: Bilirubin, Direct: 0.11 mg/dL (ref 0.00–0.40)

## 2019-04-03 LAB — TSH: TSH: 6.69 u[IU]/mL — ABNORMAL HIGH (ref 0.450–4.500)

## 2019-10-11 ENCOUNTER — Encounter: Payer: Self-pay | Admitting: Cardiology

## 2019-10-11 ENCOUNTER — Other Ambulatory Visit: Payer: Self-pay

## 2019-10-11 ENCOUNTER — Ambulatory Visit: Payer: Medicare Other | Admitting: Cardiology

## 2019-10-11 VITALS — BP 128/58 | HR 72 | Ht 62.0 in | Wt 128.4 lb

## 2019-10-11 DIAGNOSIS — I10 Essential (primary) hypertension: Secondary | ICD-10-CM

## 2019-10-11 DIAGNOSIS — I480 Paroxysmal atrial fibrillation: Secondary | ICD-10-CM | POA: Diagnosis not present

## 2019-10-11 DIAGNOSIS — E782 Mixed hyperlipidemia: Secondary | ICD-10-CM | POA: Diagnosis not present

## 2019-10-11 DIAGNOSIS — Z7901 Long term (current) use of anticoagulants: Secondary | ICD-10-CM

## 2019-10-11 DIAGNOSIS — E039 Hypothyroidism, unspecified: Secondary | ICD-10-CM

## 2019-10-11 DIAGNOSIS — E785 Hyperlipidemia, unspecified: Secondary | ICD-10-CM

## 2019-10-11 DIAGNOSIS — Z79899 Other long term (current) drug therapy: Secondary | ICD-10-CM

## 2019-10-11 NOTE — Progress Notes (Signed)
Cardiology Office Note:    Date:  10/11/2019   ID:  Latasha Barber, DOB 1929/02/06, MRN [id number]  PCP:  Raina Mina., MD  Cardiologist:  Jenean Lindau, MD   Referring MD: Raina Mina., MD    ASSESSMENT:    1. AF (paroxysmal atrial fibrillation) ([place])   2. Essential (primary) hypertension   3. Mixed hyperlipidemia   4. Anticoagulant long-term use   5. Dyslipidemia   6. Long-term use of high-risk medication    PLAN:    In order of problems listed above:  1. Primary prevention stressed with the patient.  Importance of compliance with diet medication stressed and she vocalized understanding. 2. Essential hypertension: Blood pressure stable.  Importance of regular exercise stressed and she is walking age appropriately. 3. Paroxysmal atrial fibrillation:I discussed with the patient atrial fibrillation, disease process. Management and therapy including rate and rhythm control, anticoagulation benefits and potential risks were discussed extensively with the patient. Patient had multiple questions which were answered to patient's satisfaction. 4. Mixed dyslipidemia: Diet was emphasized.  She will have blood work today including lipids 5. Patient will be seen in follow-up appointment in 6 months or earlier if the patient has any concerns    Medication Adjustments/Labs and Tests Ordered: Current medicines are reviewed at length with the patient today.  Concerns regarding medicines are outlined above.  No orders of the defined types were placed in this encounter.  No orders of the defined types were placed in this encounter.    No chief complaint on file.    History of Present Illness:    Latasha Barber is a 86 y.o. female.  Patient has past medical history of paroxysmal atrial fibrillation, essential hypertension and dyslipidemia.  She denies any problems at this time and takes care of activities of daily living.  No chest pain orthopnea or PND.  At the time of my  evaluation, the patient is alert awake oriented and in no distress.  Past Medical History:  Diagnosis Date  . Acquired hypothyroidism 04/14/2015   Last Assessment & Plan:  Update her TSH for her today and refill her med for her through the mail order  . Anemia 04/14/2015   Last Assessment & Plan:  Update her CBC and ensure that this is stable for her  . Anticoagulant long-term use 11/19/2016  . Atrial fibrillation ([place])   . Dyslipidemia 10/28/2014  . Essential (primary) hypertension 10/28/2014  . Hyperglycemia 04/14/2015   Last Assessment & Plan:  She is doing well with her diet and she is going to have labs drawn for her sugar overall  . Hyperlipemia   . Hypertension   . Leiomyoma of uterus 04/14/2015  . Long-term use of high-risk medication 04/14/2015  . Mixed hyperlipidemia 04/14/2015   Last Assessment & Plan:  Update the lipids for her today and she is going to have this done  . Osteopenia 03/22/2016   2012: -1.2    Past Surgical History:  Procedure Laterality Date  . APPENDECTOMY    . BREAST LUMPECTOMY    . HAND SURGERY    . KNEE SURGERY    . TONSILLECTOMY      Current Medications: Current Meds  Medication Sig  . amiodarone (PACERONE) 200 MG tablet Take 0.5 tablets by mouth daily.  Marland Kitchen apixaban (ELIQUIS) 5 MG TABS tablet Take 5 mg by mouth 2 (two) times daily.  Marland Kitchen b complex vitamins capsule Take 1 capsule by mouth daily.  . Calcium Carbonate-Vitamin D 600-400 MG-UNIT  per tablet Take 1 tablet by mouth 2 (two) times daily.  . ferrous sulfate 325 (65 FE) MG tablet Take 1 tablet by mouth daily.  Marland Kitchen levothyroxine (SYNTHROID, LEVOTHROID) 112 MCG tablet TAKE 1 TABLET BY MOUTH  DAILY AT 6AM  . lisinopril-hydrochlorothiazide (PRINZIDE,ZESTORETIC) 20-12.5 MG per tablet Take 1 tablet by mouth daily.  Marland Kitchen lovastatin (MEVACOR) 40 MG tablet Take 40 mg by mouth at bedtime.     Allergies:   Patient has no known allergies.   Social History   Socioeconomic History  . Marital status: Widowed     Spouse name: Not on file  . Number of children: Not on file  . Years of education: Not on file  . Highest education level: Not on file  Occupational History  . Occupation: Part Time [employer]    Comment: [employer]  Tobacco Use  . Smoking status: Never Smoker  . Smokeless tobacco: Never Used  Substance and Sexual Activity  . Alcohol use: No    Alcohol/week: 0.0 standard drinks  . Drug use: No  . Sexual activity: Not on file  Other Topics Concern  . Not on file  Social History Narrative  . Not on file   Social Determinants of Health   Financial Resource Strain:   . Difficulty of Paying Living Expenses:   Food Insecurity:   . Worried About Charity fundraiser in the Last Year:   . Arboriculturist in the Last Year:   Transportation Needs:   . Film/video editor (Medical):   Marland Kitchen Lack of Transportation (Non-Medical):   Physical Activity:   . Days of Exercise per Week:   . Minutes of Exercise per Session:   Stress:   . Feeling of Stress :   Social Connections:   . Frequency of Communication with Friends and Family:   . Frequency of Social Gatherings with Friends and Family:   . Attends Religious Services:   . Active Member of Clubs or Organizations:   . Attends Archivist Meetings:   Marland Kitchen Marital Status:      Family History: The patient's family history includes Alzheimer's disease in her sister; Breast cancer in her daughter; COPD in her brother and sister; Cancer in her mother; Multiple myeloma in her sister.  ROS:   Please see the history of present illness.    All other systems reviewed and are negative.  EKGs/Labs/Other Studies Reviewed:    The following studies were reviewed today: EKG reveals sinus rhythm and nonspecific ST-T changes.   Recent Labs: 04/02/2019: ALT 12; BUN 19; Creatinine, Ser 1.12; Hemoglobin 12.8; Platelets 230; Potassium 4.3; Sodium 141; TSH 6.690  Recent Lipid Panel    Component Value Date/Time   CHOL 172 04/02/2019  1104   TRIG 120 04/02/2019 1104   HDL 54 04/02/2019 1104   CHOLHDL 3.2 04/02/2019 1104   LDLCALC 97 04/02/2019 1104    Physical Exam:    VS:  BP (!) 128/58   Pulse 72   Ht 5' 2" (1.575 m)   Wt 128 lb 6.4 oz (58.2 kg)   SpO2 94%   BMI 23.48 kg/m     Wt Readings from Last 3 Encounters:  10/11/19 128 lb 6.4 oz (58.2 kg)  03/30/19 133 lb (60.3 kg)  12/06/18 133 lb (60.3 kg)     GEN: Patient is in no acute distress HEENT: Normal NECK: No JVD; No carotid bruits LYMPHATICS: No lymphadenopathy CARDIAC: Hear sounds regular, 2/6 systolic murmur at the  apex. RESPIRATORY:  Clear to auscultation without rales, wheezing or rhonchi  ABDOMEN: Soft, non-tender, non-distended MUSCULOSKELETAL:  No edema; No deformity  SKIN: Warm and dry NEUROLOGIC:  Alert and oriented x 3 PSYCHIATRIC:  Normal affect   Signed, Jenean Lindau, MD  10/11/2019 4:24 PM    [facility]

## 2019-10-11 NOTE — Patient Instructions (Signed)

## 2019-10-12 LAB — CBC WITH DIFFERENTIAL/PLATELET
Basophils Absolute: 0.1 10*3/uL (ref 0.0–0.2)
Basos: 1 %
EOS (ABSOLUTE): 0.2 10*3/uL (ref 0.0–0.4)
Eos: 2 %
Hematocrit: 39.8 % (ref 34.0–46.6)
Hemoglobin: 13.6 g/dL (ref 11.1–15.9)
Immature Grans (Abs): 0 10*3/uL (ref 0.0–0.1)
Immature Granulocytes: 1 %
Lymphocytes Absolute: 2 10*3/uL (ref 0.7–3.1)
Lymphs: 23 %
MCH: 30.4 pg (ref 26.6–33.0)
MCHC: 34.2 g/dL (ref 31.5–35.7)
MCV: 89 fL (ref 79–97)
Monocytes Absolute: 0.7 10*3/uL (ref 0.1–0.9)
Monocytes: 8 %
Neutrophils Absolute: 5.5 10*3/uL (ref 1.4–7.0)
Neutrophils: 65 %
Platelets: 235 10*3/uL (ref 150–450)
RBC: 4.47 x10E6/uL (ref 3.77–5.28)
RDW: 13.2 % (ref 11.7–15.4)
WBC: 8.4 10*3/uL (ref 3.4–10.8)

## 2019-10-12 LAB — BASIC METABOLIC PANEL
BUN/Creatinine Ratio: 18 (ref 12–28)
BUN: 21 mg/dL (ref 8–27)
CO2: 26 mmol/L (ref 20–29)
Calcium: 8.9 mg/dL (ref 8.7–10.3)
Chloride: 102 mmol/L (ref 96–106)
Creatinine, Ser: 1.2 mg/dL — ABNORMAL HIGH (ref 0.57–1.00)
GFR calc Af Amer: 46 mL/min/{1.73_m2} — ABNORMAL LOW (ref >59)
GFR calc non Af Amer: 40 mL/min/{1.73_m2} — ABNORMAL LOW (ref >59)
Glucose: 118 mg/dL — ABNORMAL HIGH (ref 65–99)
Potassium: 4.1 mmol/L (ref 3.5–5.2)
Sodium: 140 mmol/L (ref 134–144)

## 2019-10-12 LAB — HEPATIC FUNCTION PANEL
ALT: 15 IU/L (ref 0–32)
AST: 19 IU/L (ref 0–40)
Albumin: 4.1 g/dL (ref 3.6–4.6)
Alkaline Phosphatase: 82 IU/L (ref 48–121)
Bilirubin Total: 0.3 mg/dL (ref 0.0–1.2)
Bilirubin, Direct: 0.09 mg/dL (ref 0.00–0.40)
Total Protein: 6.5 g/dL (ref 6.0–8.5)

## 2019-10-12 LAB — LIPID PANEL
Chol/HDL Ratio: 4.3 ratio (ref 0.0–4.4)
Cholesterol, Total: 202 mg/dL — ABNORMAL HIGH (ref 100–199)
HDL: 47 mg/dL (ref >39)
LDL Chol Calc (NIH): 107 mg/dL — ABNORMAL HIGH (ref 0–99)
Triglycerides: 281 mg/dL — ABNORMAL HIGH (ref 0–149)
VLDL Cholesterol Cal: 48 mg/dL — ABNORMAL HIGH (ref 5–40)

## 2019-10-12 LAB — TSH: TSH: 0.852 u[IU]/mL (ref 0.450–4.500)

## 2019-10-12 NOTE — Addendum Note (Signed)
Addended by: Truddie Hidden on: 10/12/2019 02:24 PM   Modules accepted: Orders

## 2020-04-11 DIAGNOSIS — E785 Hyperlipidemia, unspecified: Secondary | ICD-10-CM | POA: Insufficient documentation

## 2020-04-11 DIAGNOSIS — I10 Essential (primary) hypertension: Secondary | ICD-10-CM | POA: Insufficient documentation

## 2020-04-14 ENCOUNTER — Other Ambulatory Visit: Payer: Self-pay

## 2020-04-14 ENCOUNTER — Encounter: Payer: Self-pay | Admitting: Cardiology

## 2020-04-14 ENCOUNTER — Ambulatory Visit: Payer: Medicare Other | Admitting: Cardiology

## 2020-04-14 VITALS — BP 148/68 | HR 73 | Ht 63.0 in | Wt 129.2 lb

## 2020-04-14 DIAGNOSIS — E782 Mixed hyperlipidemia: Secondary | ICD-10-CM

## 2020-04-14 DIAGNOSIS — I10 Essential (primary) hypertension: Secondary | ICD-10-CM | POA: Diagnosis not present

## 2020-04-14 DIAGNOSIS — I480 Paroxysmal atrial fibrillation: Secondary | ICD-10-CM

## 2020-04-14 DIAGNOSIS — Z7901 Long term (current) use of anticoagulants: Secondary | ICD-10-CM | POA: Diagnosis not present

## 2020-04-14 NOTE — Patient Instructions (Signed)

## 2020-04-14 NOTE — Progress Notes (Signed)
Cardiology Office Note:    Date:  04/14/2020   ID:  Latasha Barber, DOB 01-Oct-1928, MRN [id number]  PCP:  Raina Mina., MD  Cardiologist:  Jenean Lindau, MD   Referring MD: Raina Mina., MD    ASSESSMENT:    1. AF (paroxysmal atrial fibrillation) ([place])   2. Essential hypertension   3. Mixed hyperlipidemia   4. Anticoagulant long-term use    PLAN:    In order of problems listed above:  1. Primary prevention stressed with the patient.  Importance of compliance with diet medication stressed and she vocalized understanding.  She is amazingly active for her age and appears much younger than stated age. 2. Paroxysmal atrial fibrillation:I discussed with the patient atrial fibrillation, disease process. Management and therapy including rate and rhythm control, anticoagulation benefits and potential risks were discussed extensively with the patient. Patient had multiple questions which were answered to patient's satisfaction. 3. Mixed dyslipidemia: Diet was emphasized.  She is on statin therapy and follows it religiously.  Her blood work report sent primary care provider notes were reviewed by me. 4. Long-term anticoagulation use: Benefits and risks explained and she vocalized understanding 5. Patient will be seen in follow-up appointment in 6 months or earlier if the patient has any concerns    Medication Adjustments/Labs and Tests Ordered: Current medicines are reviewed at length with the patient today.  Concerns regarding medicines are outlined above.  No orders of the defined types were placed in this encounter.  No orders of the defined types were placed in this encounter.    No chief complaint on file.    History of Present Illness:    Latasha Barber is a 87 y.o. female.  Patient has past medical history of paroxysmal atrial fibrillation, essential hypertension and dyslipidemia.  She denies any problems at this time and takes care of activities of daily living.  No  chest pain orthopnea or PND.  At the time of my evaluation, the patient is alert awake oriented and in no distress.  Past Medical History:  Diagnosis Date  . Abnormal PFT 01/24/2018  . Acquired hypothyroidism 04/14/2015   Last Assessment & Plan:  Update her TSH for her today and refill her med for her through the mail order  . AF (paroxysmal atrial fibrillation) ([place]) 10/28/2014  . Anemia 04/14/2015   Last Assessment & Plan:  Update her CBC and ensure that this is stable for her  . Anticoagulant long-term use 11/19/2016  . Atrial fibrillation ([place])   . Chronic kidney disease, stage III (moderate) ([place]) 01/24/2018  . Dyslipidemia 10/28/2014  . Essential (primary) hypertension 10/28/2014  . Essential hypertension 10/28/2014   Last Assessment & Plan:  Formatting of this note might be different from the original. This was improved for her on the repeat and she is doing well  . Hyperglycemia 04/14/2015   Last Assessment & Plan:  She is doing well with her diet and she is going to have labs drawn for her sugar overall  . Hyperlipemia   . Hypertension   . Leiomyoma of uterus 04/14/2015  . Long-term use of high-risk medication 04/14/2015  . Mixed hyperlipidemia 04/14/2015   Last Assessment & Plan:  Update the lipids for her today and she is going to have this done  . Osteopenia 03/22/2016   2012: -1.2    Past Surgical History:  Procedure Laterality Date  . APPENDECTOMY    . BREAST LUMPECTOMY    . HAND SURGERY    .  KNEE SURGERY    . TONSILLECTOMY      Current Medications: Current Meds  Medication Sig  . amiodarone (PACERONE) 200 MG tablet Take 0.5 tablets by mouth daily.  Marland Kitchen apixaban (ELIQUIS) 5 MG TABS tablet Take 5 mg by mouth 2 (two) times daily.  Marland Kitchen b complex vitamins capsule Take 1 capsule by mouth daily.  . Calcium Carbonate-Vitamin D 600-400 MG-UNIT per tablet Take 1 tablet by mouth 2 (two) times daily.  . ferrous sulfate 325 (65 FE) MG tablet Take 1 tablet by mouth daily.  Marland Kitchen  levothyroxine (SYNTHROID, LEVOTHROID) 112 MCG tablet Take 112 mcg by mouth daily before breakfast.  . lisinopril-hydrochlorothiazide (PRINZIDE,ZESTORETIC) 20-12.5 MG per tablet Take 1 tablet by mouth daily.  Marland Kitchen lovastatin (MEVACOR) 40 MG tablet Take 40 mg by mouth at bedtime.     Allergies:   Patient has no known allergies.   Social History   Socioeconomic History  . Marital status: Widowed    Spouse name: Not on file  . Number of children: Not on file  . Years of education: Not on file  . Highest education level: Not on file  Occupational History  . Occupation: Part Time [employer]    Comment: [employer]  Tobacco Use  . Smoking status: Never Smoker  . Smokeless tobacco: Never Used  Substance and Sexual Activity  . Alcohol use: No    Alcohol/week: 0.0 standard drinks  . Drug use: No  . Sexual activity: Not on file  Other Topics Concern  . Not on file  Social History Narrative  . Not on file   Social Determinants of Health   Financial Resource Strain: Not on file  Food Insecurity: Not on file  Transportation Needs: Not on file  Physical Activity: Not on file  Stress: Not on file  Social Connections: Not on file     Family History: The patient's family history includes Alzheimer's disease in her sister; Breast cancer in her daughter; COPD in her brother and sister; Cancer in her mother; Multiple myeloma in her sister.  ROS:   Please see the history of present illness.    All other systems reviewed and are negative.  EKGs/Labs/Other Studies Reviewed:    The following studies were reviewed today: EKG reveals sinus rhythm and nonspecific ST-T changes   Recent Labs: 10/11/2019: ALT 15; BUN 21; Creatinine, Ser 1.20; Hemoglobin 13.6; Platelets 235; Potassium 4.1; Sodium 140; TSH 0.852  Recent Lipid Panel    Component Value Date/Time   CHOL 202 (H) 10/11/2019 1637   TRIG 281 (H) 10/11/2019 1637   HDL 47 10/11/2019 1637   CHOLHDL 4.3 10/11/2019 1637    LDLCALC 107 (H) 10/11/2019 1637    Physical Exam:    VS:  BP (!) 148/68   Pulse 73   Ht $R'5\' 3"'RH$  (1.6 m)   Wt 129 lb 3.2 oz (58.6 kg)   SpO2 95%   BMI 22.89 kg/m     Wt Readings from Last 3 Encounters:  04/14/20 129 lb 3.2 oz (58.6 kg)  10/11/19 128 lb 6.4 oz (58.2 kg)  03/30/19 133 lb (60.3 kg)     GEN: Patient is in no acute distress HEENT: Normal NECK: No JVD; No carotid bruits LYMPHATICS: No lymphadenopathy CARDIAC: Hear sounds regular, 2/6 systolic murmur at the apex. RESPIRATORY:  Clear to auscultation without rales, wheezing or rhonchi  ABDOMEN: Soft, non-tender, non-distended MUSCULOSKELETAL:  No edema; No deformity  SKIN: Warm and dry NEUROLOGIC:  Alert and oriented x 3  PSYCHIATRIC:  Normal affect   Signed, Jenean Lindau, MD  04/14/2020 2:37 PM    [place]

## 2020-05-20 DIAGNOSIS — L989 Disorder of the skin and subcutaneous tissue, unspecified: Secondary | ICD-10-CM

## 2020-05-20 HISTORY — DX: Disorder of the skin and subcutaneous tissue, unspecified: L98.9

## 2020-10-14 ENCOUNTER — Ambulatory Visit: Payer: Medicare Other | Admitting: Cardiology

## 2020-10-14 ENCOUNTER — Encounter: Payer: Self-pay | Admitting: Cardiology

## 2020-10-14 ENCOUNTER — Other Ambulatory Visit: Payer: Self-pay

## 2020-10-14 VITALS — BP 160/62 | HR 57 | Ht 64.5 in | Wt 122.6 lb

## 2020-10-14 DIAGNOSIS — E782 Mixed hyperlipidemia: Secondary | ICD-10-CM

## 2020-10-14 DIAGNOSIS — I10 Essential (primary) hypertension: Secondary | ICD-10-CM

## 2020-10-14 DIAGNOSIS — I480 Paroxysmal atrial fibrillation: Secondary | ICD-10-CM | POA: Diagnosis not present

## 2020-10-14 DIAGNOSIS — E559 Vitamin D deficiency, unspecified: Secondary | ICD-10-CM

## 2020-10-14 DIAGNOSIS — Z7901 Long term (current) use of anticoagulants: Secondary | ICD-10-CM

## 2020-10-14 DIAGNOSIS — E039 Hypothyroidism, unspecified: Secondary | ICD-10-CM

## 2020-10-14 NOTE — Progress Notes (Signed)
EKG

## 2020-10-14 NOTE — Patient Instructions (Signed)
Medication Instructions:  No medication changes. *If you need a refill on your cardiac medications before your next appointment, please call your pharmacy*   Lab Work: Your physician recommends that you have labs done in the office today. Your test included  basic metabolic panel, complete blood count, TSH, vitamin D, liver function and lipids.  If you have labs (blood work) drawn today and your tests are completely normal, you will receive your results only by: [place] (if you have MyChart) OR A paper copy in the mail If you have any lab test that is abnormal or we need to change your treatment, we will call you to review the results.   Testing/Procedures: None ordered   Follow-Up: At [facility], you and your health needs are our priority.  As part of our continuing mission to provide you with exceptional heart care, we have created designated Provider Care Teams.  These Care Teams include your primary Cardiologist (physician) and Advanced Practice Providers (APPs -  Physician Assistants and Nurse Practitioners) who all work together to provide you with the care you need, when you need it.  We recommend signing up for the patient portal called "MyChart".  Sign up information is provided on this After Visit Summary.  MyChart is used to connect with patients for Virtual Visits (Telemedicine).  Patients are able to view lab/test results, encounter notes, upcoming appointments, etc.  Non-urgent messages can be sent to your provider as well.   To learn more about what you can do with MyChart, go to [email or web address].    Your next appointment:   6 month(s)  The format for your next appointment:   In Person  Provider:   Jyl Heinz, MD   Other Instructions NA

## 2020-10-14 NOTE — Progress Notes (Signed)
Cardiology Office Note:    Date:  10/14/2020   ID:  Latasha Barber, DOB 1928/10/19, MRN [id number]  PCP:  Raina Mina., MD  Cardiologist:  Jenean Lindau, MD   Referring MD: Raina Mina., MD    ASSESSMENT:    1. AF (paroxysmal atrial fibrillation) ([place])   2. Essential hypertension   3. Mixed hyperlipidemia   4. Anticoagulant long-term use    PLAN:    In order of problems listed above:  Primary prevention stressed with the patient.  Importance of compliance with diet medication stressed and she vocalized understanding. Paroxysmal atrial fibrillation:I discussed with the patient atrial fibrillation, disease process. Management and therapy including rate and rhythm control, anticoagulation benefits and potential risks were discussed extensively with the patient. Patient had multiple questions which were answered to patient's satisfaction. Mixed dyslipidemia: Lipids were reviewed and diet was emphasized.  She is fasting and will get complete blood work today. Essential hypertension: Blood pressure stable and diet was emphasized.  She tells me that her blood pressure readings at home are fine and he has an element of whitecoat hypertension. Patient will be seen in follow-up appointment in 6 months or earlier if the patient has any concerns    Medication Adjustments/Labs and Tests Ordered: Current medicines are reviewed at length with the patient today.  Concerns regarding medicines are outlined above.  No orders of the defined types were placed in this encounter.  No orders of the defined types were placed in this encounter.    Chief Complaint  Patient presents with   Follow-up     History of Present Illness:    Latasha Barber is a 87 y.o. female.  Patient has past medical history of paroxysmal atrial fibrillation, essential hypertension mixed dyslipidemia.  She denies any problems at this time and takes care of activities of daily living.  No chest pain orthopnea or  PND.  At the time of my evaluation, the patient is alert awake oriented and in no distress.  Past Medical History:  Diagnosis Date   Abnormal PFT 01/24/2018   Acquired hypothyroidism 04/14/2015   Last Assessment & Plan:  Update her TSH for her today and refill her med for her through the mail order   AF (paroxysmal atrial fibrillation) ([place]) 10/28/2014   Anemia 04/14/2015   Last Assessment & Plan:  Update her CBC and ensure that this is stable for her   Anticoagulant long-term use 11/19/2016   Atrial fibrillation ([place])    Chronic kidney disease, stage III (moderate) ([place]) 01/24/2018   Dyslipidemia 10/28/2014   Essential (primary) hypertension 10/28/2014   Essential hypertension 10/28/2014   Last Assessment & Plan:  Formatting of this note might be different from the original. This was improved for her on the repeat and she is doing well   Hyperglycemia 04/14/2015   Last Assessment & Plan:  She is doing well with her diet and she is going to have labs drawn for her sugar overall   Hyperlipemia    Hypertension    Leiomyoma of uterus 04/14/2015   Long-term use of high-risk medication 04/14/2015   Mixed hyperlipidemia 04/14/2015   Last Assessment & Plan:  Update the lipids for her today and she is going to have this done   Osteopenia 03/22/2016   2012: -1.2    Past Surgical History:  Procedure Laterality Date   APPENDECTOMY     BREAST LUMPECTOMY     HAND SURGERY     KNEE SURGERY  TONSILLECTOMY      Current Medications: Current Meds  Medication Sig   amiodarone (PACERONE) 200 MG tablet Take 0.5 tablets by mouth daily.   apixaban (ELIQUIS) 5 MG TABS tablet Take 5 mg by mouth 2 (two) times daily.   b complex vitamins capsule Take 1 capsule by mouth daily. Unknown strength   Calcium Carbonate-Vitamin D 600-400 MG-UNIT per tablet Take 1 tablet by mouth 2 (two) times daily.   ferrous sulfate 325 (65 FE) MG tablet Take 1 tablet by mouth daily.   levothyroxine (SYNTHROID, LEVOTHROID) 112 MCG  tablet Take 112 mcg by mouth daily before breakfast.   lisinopril-hydrochlorothiazide (PRINZIDE,ZESTORETIC) 20-12.5 MG per tablet Take 1 tablet by mouth daily.   lovastatin (MEVACOR) 40 MG tablet Take 40 mg by mouth at bedtime.     Allergies:   Patient has no known allergies.   Social History   Socioeconomic History   Marital status: Widowed    Spouse name: Not on file   Number of children: Not on file   Years of education: Not on file   Highest education level: Not on file  Occupational History   Occupation: Part Time [employer]    Comment: [employer]  Tobacco Use   Smoking status: Never   Smokeless tobacco: Never  Substance and Sexual Activity   Alcohol use: No    Alcohol/week: 0.0 standard drinks   Drug use: No   Sexual activity: Not on file  Other Topics Concern   Not on file  Social History Narrative   Not on file   Social Determinants of Health   Financial Resource Strain: Not on file  Food Insecurity: Not on file  Transportation Needs: Not on file  Physical Activity: Not on file  Stress: Not on file  Social Connections: Not on file     Family History: The patient's family history includes Alzheimer's disease in her sister; Breast cancer in her daughter; COPD in her brother and sister; Cancer in her mother; Multiple myeloma in her sister.  ROS:   Please see the history of present illness.    All other systems reviewed and are negative.  EKGs/Labs/Other Studies Reviewed:    The following studies were reviewed today: EKG reveals sinus rhythm and nonspecific ST-T changes   Recent Labs: No results found for requested labs within last 8760 hours.  Recent Lipid Panel    Component Value Date/Time   CHOL 202 (H) 10/11/2019 1637   TRIG 281 (H) 10/11/2019 1637   HDL 47 10/11/2019 1637   CHOLHDL 4.3 10/11/2019 1637   LDLCALC 107 (H) 10/11/2019 1637    Physical Exam:    VS:  BP (!) 160/62 (BP Location: Right Arm, Patient Position: Sitting)    Pulse (!) 57   Ht 5' 4.5" (1.638 m)   Wt 122 lb 9.6 oz (55.6 kg)   SpO2 95%   BMI 20.72 kg/m     Wt Readings from Last 3 Encounters:  10/14/20 122 lb 9.6 oz (55.6 kg)  04/14/20 129 lb 3.2 oz (58.6 kg)  10/11/19 128 lb 6.4 oz (58.2 kg)     GEN: Patient is in no acute distress HEENT: Normal NECK: No JVD; No carotid bruits LYMPHATICS: No lymphadenopathy CARDIAC: Hear sounds regular, 2/6 systolic murmur at the apex. RESPIRATORY:  Clear to auscultation without rales, wheezing or rhonchi  ABDOMEN: Soft, non-tender, non-distended MUSCULOSKELETAL:  No edema; No deformity  SKIN: Warm and dry NEUROLOGIC:  Alert and oriented x 3 PSYCHIATRIC:  Normal affect  Signed, Jenean Lindau, MD  10/14/2020 9:29 AM    [facility]

## 2020-10-15 LAB — CBC WITH DIFFERENTIAL/PLATELET
Basophils Absolute: 0.1 10*3/uL (ref 0.0–0.2)
Basos: 1 %
EOS (ABSOLUTE): 0.2 10*3/uL (ref 0.0–0.4)
Eos: 2 %
Hematocrit: 39.5 % (ref 34.0–46.6)
Hemoglobin: 13.1 g/dL (ref 11.1–15.9)
Immature Grans (Abs): 0 10*3/uL (ref 0.0–0.1)
Immature Granulocytes: 0 %
Lymphocytes Absolute: 1.9 10*3/uL (ref 0.7–3.1)
Lymphs: 24 %
MCH: 28.1 pg (ref 26.6–33.0)
MCHC: 33.2 g/dL (ref 31.5–35.7)
MCV: 85 fL (ref 79–97)
Monocytes Absolute: 0.6 10*3/uL (ref 0.1–0.9)
Monocytes: 8 %
Neutrophils Absolute: 5.3 10*3/uL (ref 1.4–7.0)
Neutrophils: 65 %
Platelets: 213 10*3/uL (ref 150–450)
RBC: 4.66 x10E6/uL (ref 3.77–5.28)
RDW: 13.8 % (ref 11.7–15.4)
WBC: 8.1 10*3/uL (ref 3.4–10.8)

## 2020-10-15 LAB — BASIC METABOLIC PANEL
BUN/Creatinine Ratio: 18 (ref 12–28)
BUN: 20 mg/dL (ref 10–36)
CO2: 24 mmol/L (ref 20–29)
Calcium: 9.2 mg/dL (ref 8.7–10.3)
Chloride: 102 mmol/L (ref 96–106)
Creatinine, Ser: 1.12 mg/dL — ABNORMAL HIGH (ref 0.57–1.00)
Glucose: 89 mg/dL (ref 65–99)
Potassium: 4.4 mmol/L (ref 3.5–5.2)
Sodium: 142 mmol/L (ref 134–144)
eGFR: 47 mL/min/{1.73_m2} — ABNORMAL LOW (ref >59)

## 2020-10-15 LAB — TSH: TSH: 1.37 u[IU]/mL (ref 0.450–4.500)

## 2020-10-15 LAB — VITAMIN D 25 HYDROXY (VIT D DEFICIENCY, FRACTURES): Vit D, 25-Hydroxy: 55.1 ng/mL (ref 30.0–100.0)

## 2020-10-15 LAB — LIPID PANEL
Chol/HDL Ratio: 3.1 ratio (ref 0.0–4.4)
Cholesterol, Total: 199 mg/dL (ref 100–199)
HDL: 64 mg/dL (ref >39)
LDL Chol Calc (NIH): 115 mg/dL — ABNORMAL HIGH (ref 0–99)
Triglycerides: 111 mg/dL (ref 0–149)
VLDL Cholesterol Cal: 20 mg/dL (ref 5–40)

## 2020-10-15 LAB — HEPATIC FUNCTION PANEL
ALT: 15 IU/L (ref 0–32)
AST: 20 IU/L (ref 0–40)
Albumin: 4.4 g/dL (ref 3.5–4.6)
Alkaline Phosphatase: 73 IU/L (ref 44–121)
Bilirubin Total: 0.5 mg/dL (ref 0.0–1.2)
Bilirubin, Direct: 0.13 mg/dL (ref 0.00–0.40)
Total Protein: 6.8 g/dL (ref 6.0–8.5)

## 2020-11-29 DIAGNOSIS — I4819 Other persistent atrial fibrillation: Secondary | ICD-10-CM | POA: Diagnosis not present

## 2020-11-30 DIAGNOSIS — I361 Nonrheumatic tricuspid (valve) insufficiency: Secondary | ICD-10-CM | POA: Diagnosis not present

## 2020-11-30 DIAGNOSIS — R0989 Other specified symptoms and signs involving the circulatory and respiratory systems: Secondary | ICD-10-CM | POA: Diagnosis not present

## 2020-11-30 DIAGNOSIS — I340 Nonrheumatic mitral (valve) insufficiency: Secondary | ICD-10-CM | POA: Diagnosis not present

## 2020-11-30 DIAGNOSIS — Z7901 Long term (current) use of anticoagulants: Secondary | ICD-10-CM | POA: Diagnosis not present

## 2020-11-30 DIAGNOSIS — I4819 Other persistent atrial fibrillation: Secondary | ICD-10-CM | POA: Diagnosis not present

## 2020-12-01 DIAGNOSIS — I4819 Other persistent atrial fibrillation: Secondary | ICD-10-CM | POA: Diagnosis not present

## 2020-12-01 DIAGNOSIS — R0989 Other specified symptoms and signs involving the circulatory and respiratory systems: Secondary | ICD-10-CM | POA: Diagnosis not present

## 2020-12-01 DIAGNOSIS — I480 Paroxysmal atrial fibrillation: Secondary | ICD-10-CM | POA: Diagnosis not present

## 2020-12-01 DIAGNOSIS — Z7901 Long term (current) use of anticoagulants: Secondary | ICD-10-CM | POA: Diagnosis not present

## 2020-12-02 DIAGNOSIS — Z7901 Long term (current) use of anticoagulants: Secondary | ICD-10-CM | POA: Diagnosis not present

## 2020-12-02 DIAGNOSIS — I4819 Other persistent atrial fibrillation: Secondary | ICD-10-CM | POA: Diagnosis not present

## 2020-12-02 DIAGNOSIS — R0989 Other specified symptoms and signs involving the circulatory and respiratory systems: Secondary | ICD-10-CM | POA: Diagnosis not present

## 2020-12-03 ENCOUNTER — Telehealth: Payer: Self-pay | Admitting: Cardiology

## 2020-12-03 NOTE — Telephone Encounter (Signed)
Patient's daughter is calling to schedule a hospital follow-up with Dr. Geraldo Pitter. I informed her that Dr. Geraldo Pitter does not have any availability during the next few weeks. She would like to know if the patient can be worked in anywhere. She states the patient is unable to travel to the [employer] office.+

## 2020-12-03 NOTE — Telephone Encounter (Signed)
Left message on patients voicemail to please return our call.   

## 2020-12-05 ENCOUNTER — Encounter: Payer: Self-pay | Admitting: Cardiology

## 2020-12-05 ENCOUNTER — Other Ambulatory Visit: Payer: Self-pay

## 2020-12-05 ENCOUNTER — Ambulatory Visit: Payer: Medicare Other | Admitting: Cardiology

## 2020-12-05 VITALS — BP 160/64 | HR 69 | Ht 64.0 in | Wt 121.2 lb

## 2020-12-05 DIAGNOSIS — I10 Essential (primary) hypertension: Secondary | ICD-10-CM

## 2020-12-05 DIAGNOSIS — E782 Mixed hyperlipidemia: Secondary | ICD-10-CM

## 2020-12-05 DIAGNOSIS — I4819 Other persistent atrial fibrillation: Secondary | ICD-10-CM | POA: Insufficient documentation

## 2020-12-05 DIAGNOSIS — S52502A Unspecified fracture of the lower end of left radius, initial encounter for closed fracture: Secondary | ICD-10-CM | POA: Insufficient documentation

## 2020-12-05 DIAGNOSIS — R0989 Other specified symptoms and signs involving the circulatory and respiratory systems: Secondary | ICD-10-CM | POA: Insufficient documentation

## 2020-12-05 DIAGNOSIS — R7989 Other specified abnormal findings of blood chemistry: Secondary | ICD-10-CM

## 2020-12-05 DIAGNOSIS — I351 Nonrheumatic aortic (valve) insufficiency: Secondary | ICD-10-CM

## 2020-12-05 HISTORY — DX: Other specified symptoms and signs involving the circulatory and respiratory systems: R09.89

## 2020-12-05 HISTORY — DX: Other specified abnormal findings of blood chemistry: R79.89

## 2020-12-05 HISTORY — DX: Unspecified fracture of the lower end of left radius, initial encounter for closed fracture: S52.502A

## 2020-12-05 MED ORDER — AMIODARONE HCL 200 MG PO TABS: 200.0000 mg | ORAL_TABLET | Freq: Every day | ORAL | 3 refills | Status: DC

## 2020-12-05 NOTE — Progress Notes (Signed)
Cardiology Office Note:    Date:  12/05/2020   ID:  Latasha Barber, DOB 1928-04-21, MRN [id number]  PCP:  Raina Mina., MD  Cardiologist:  Jenean Lindau, MD   Referring MD: Raina Mina., MD    ASSESSMENT:    1. Persistent atrial fibrillation ([place])   2. Mixed hyperlipidemia   3. Essential hypertension   4. Moderate aortic regurgitation    PLAN:    In order of problems listed above:  Paroxysmal atrial fibrillation:I discussed with the patient atrial fibrillation, disease process. Management and therapy including rate and rhythm control, anticoagulation benefits and potential risks were discussed extensively with the patient. Patient had multiple questions which were answered to patient's satisfaction.  Amiodarone benefits and potential is explained to the patient and she vocalized understanding.  She will have a Chem-7 and LFTs done today..  Have decreased her amiodarone dose to half dose. Essential hypertension: Blood pressure stable and diet was emphasized. Mixed dyslipidemia: Patient is doing well with diet.  Lipids followed by primary care. Moderate aortic regurgitation: Stable at this time and medical management. Patient will be seen in follow-up appointment in 4 weeks or earlier if the patient has any concerns    Medication Adjustments/Labs and Tests Ordered: Current medicines are reviewed at length with the patient today.  Concerns regarding medicines are outlined above.  No orders of the defined types were placed in this encounter.  No orders of the defined types were placed in this encounter.    No chief complaint on file.    History of Present Illness:    Latasha Barber is a 87 y.o. female.  Patient has past medical history of atrial fibrillation.  Recently she went to the hospital with elevated ventricular rate and was cardioverted and this was successful in spite of and last left atrium.  She is happy about it.  She denies any chest pain orthopnea or  PND.  She takes care of activities of daily living and ambulates age appropriately.  At the time of my evaluation, the patient is alert awake oriented and in no distress.  Past Medical History:  Diagnosis Date   Abnormal PFT 01/24/2018   Acquired hypothyroidism 04/14/2015   Last Assessment & Plan:  Update her TSH for her today and refill her med for her through the mail order   AF (paroxysmal atrial fibrillation) ([place]) 10/28/2014   Anemia 04/14/2015   Last Assessment & Plan:  Update her CBC and ensure that this is stable for her   Anticoagulant long-term use 11/19/2016   Atrial fibrillation ([place])    Chronic kidney disease, stage III (moderate) ([place]) 01/24/2018   Dyslipidemia 10/28/2014   Essential (primary) hypertension 10/28/2014   Essential hypertension 10/28/2014   Last Assessment & Plan:  Formatting of this note might be different from the original. This was improved for her on the repeat and she is doing well   Hyperglycemia 04/14/2015   Last Assessment & Plan:  She is doing well with her diet and she is going to have labs drawn for her sugar overall   Hyperlipemia    Hypertension    Leiomyoma of uterus 04/14/2015   Long-term use of high-risk medication 04/14/2015   Mixed hyperlipidemia 04/14/2015   Last Assessment & Plan:  Update the lipids for her today and she is going to have this done   Osteopenia 03/22/2016   2012: -1.2    Past Surgical History:  Procedure Laterality Date   APPENDECTOMY  BREAST LUMPECTOMY     HAND SURGERY     KNEE SURGERY     TONSILLECTOMY      Current Medications: Current Meds  Medication Sig   amiodarone (PACERONE) 200 MG tablet Take 200 tablets by mouth 2 (two) times daily.   apixaban (ELIQUIS) 5 MG TABS tablet Take 2.5 mg by mouth 2 (two) times daily.   b complex vitamins capsule Take 1 capsule by mouth daily. Unknown strength   Calcium Carbonate-Vitamin D 600-400 MG-UNIT per tablet Take 1 tablet by mouth 2 (two) times daily.   ferrous sulfate 325  (65 FE) MG tablet Take 1 tablet by mouth daily.   levothyroxine (SYNTHROID) 100 MCG tablet Take 100 mcg by mouth every morning.   lisinopril-hydrochlorothiazide (PRINZIDE,ZESTORETIC) 20-12.5 MG per tablet Take 1 tablet by mouth daily.   lovastatin (MEVACOR) 40 MG tablet Take 40 mg by mouth at bedtime.   metoprolol succinate (TOPROL-XL) 25 MG 24 hr tablet Take 25 mg by mouth daily.     Allergies:   Patient has no known allergies.   Social History   Socioeconomic History   Marital status: Widowed    Spouse name: Not on file   Number of children: Not on file   Years of education: Not on file   Highest education level: Not on file  Occupational History   Occupation: Part Time [employer]    Comment: [employer]  Tobacco Use   Smoking status: Never   Smokeless tobacco: Never  Substance and Sexual Activity   Alcohol use: No    Alcohol/week: 0.0 standard drinks   Drug use: No   Sexual activity: Not on file  Other Topics Concern   Not on file  Social History Narrative   Not on file   Social Determinants of Health   Financial Resource Strain: Not on file  Food Insecurity: Not on file  Transportation Needs: Not on file  Physical Activity: Not on file  Stress: Not on file  Social Connections: Not on file     Family History: The patient's family history includes Alzheimer's disease in her sister; Breast cancer in her daughter; COPD in her brother and sister; Cancer in her mother; Multiple myeloma in her sister.  ROS:   Please see the history of present illness.    All other systems reviewed and are negative.  EKGs/Labs/Other Studies Reviewed:    The following studies were reviewed today: EKG with sinus rhythm nonspecific ST-T changes   Recent Labs: 10/14/2020: ALT 15; BUN 20; Creatinine, Ser 1.12; Hemoglobin 13.1; Platelets 213; Potassium 4.4; Sodium 142; TSH 1.370  Recent Lipid Panel    Component Value Date/Time   CHOL 199 10/14/2020 0936   TRIG 111  10/14/2020 0936   HDL 64 10/14/2020 0936   CHOLHDL 3.1 10/14/2020 0936   LDLCALC 115 (H) 10/14/2020 0936    Physical Exam:    VS:  BP (!) 160/64   Pulse 69   Ht '5\' 4"'  (1.626 m)   Wt 121 lb 3.2 oz (55 kg)   SpO2 97%   BMI 20.80 kg/m     Wt Readings from Last 3 Encounters:  12/05/20 121 lb 3.2 oz (55 kg)  10/14/20 122 lb 9.6 oz (55.6 kg)  04/14/20 129 lb 3.2 oz (58.6 kg)     GEN: Patient is in no acute distress HEENT: Normal NECK: No JVD; No carotid bruits LYMPHATICS: No lymphadenopathy CARDIAC: Hear sounds regular, 2/6 systolic murmur at the apex. RESPIRATORY:  Clear to auscultation  without rales, wheezing or rhonchi  ABDOMEN: Soft, non-tender, non-distended MUSCULOSKELETAL:  No edema; No deformity  SKIN: Warm and dry NEUROLOGIC:  Alert and oriented x 3 PSYCHIATRIC:  Normal affect   Signed, Jenean Lindau, MD  12/05/2020 4:39 PM    [facility]

## 2020-12-05 NOTE — Patient Instructions (Signed)
Medication Instructions:  Your physician has recommended you make the following change in your medication:   Decrease your Amiodarone to 200 mg once daily.  *If you need a refill on your cardiac medications before your next appointment, please call your pharmacy*   Lab Work: Your physician recommends that you have a BMET and LFT today in the office.  If you have labs (blood work) drawn today and your tests are completely normal, you will receive your results only by: [place] (if you have MyChart) OR A paper copy in the mail If you have any lab test that is abnormal or we need to change your treatment, we will call you to review the results.   Testing/Procedures: None ordered   Follow-Up: At [facility], you and your health needs are our priority.  As part of our continuing mission to provide you with exceptional heart care, we have created designated Provider Care Teams.  These Care Teams include your primary Cardiologist (physician) and Advanced Practice Providers (APPs -  Physician Assistants and Nurse Practitioners) who all work together to provide you with the care you need, when you need it.  We recommend signing up for the patient portal called "MyChart".  Sign up information is provided on this After Visit Summary.  MyChart is used to connect with patients for Virtual Visits (Telemedicine).  Patients are able to view lab/test results, encounter notes, upcoming appointments, etc.  Non-urgent messages can be sent to your provider as well.   To learn more about what you can do with MyChart, go to [email or web address].    Your next appointment:   1 month(s)  The format for your next appointment:   In Person  Provider:   Jyl Heinz, MD   Other Instructions NA

## 2020-12-06 LAB — BASIC METABOLIC PANEL
BUN/Creatinine Ratio: 15 (ref 12–28)
BUN: 15 mg/dL (ref 10–36)
CO2: 24 mmol/L (ref 20–29)
Calcium: 8.5 mg/dL — ABNORMAL LOW (ref 8.7–10.3)
Chloride: 105 mmol/L (ref 96–106)
Creatinine, Ser: 1.03 mg/dL — ABNORMAL HIGH (ref 0.57–1.00)
Glucose: 108 mg/dL — ABNORMAL HIGH (ref 70–99)
Potassium: 3.9 mmol/L (ref 3.5–5.2)
Sodium: 141 mmol/L (ref 134–144)
eGFR: 52 mL/min/{1.73_m2} — ABNORMAL LOW (ref >59)

## 2020-12-06 LAB — HEPATIC FUNCTION PANEL
ALT: 23 IU/L (ref 0–32)
AST: 20 IU/L (ref 0–40)
Albumin: 3.7 g/dL (ref 3.5–4.6)
Alkaline Phosphatase: 78 IU/L (ref 44–121)
Bilirubin Total: 0.2 mg/dL (ref 0.0–1.2)
Bilirubin, Direct: <0.1 mg/dL (ref 0.00–0.40)
Total Protein: 6.2 g/dL (ref 6.0–8.5)

## 2020-12-08 ENCOUNTER — Telehealth: Payer: Self-pay

## 2020-12-08 NOTE — Telephone Encounter (Signed)
-----   Message from Jenean Lindau, MD sent at 12/07/2020  7:13 PM EDT ----- The results of the study is unremarkable. Please inform patient. I will discuss in detail at next appointment. Cc  primary care/referring physician Jenean Lindau, MD 12/07/2020 7:13 PM

## 2020-12-08 NOTE — Telephone Encounter (Signed)
Spoke with patient regarding results and recommendation.  Patient verbalizes understanding and is agreeable to plan of care. Advised patient to call back with any issues or concerns.  

## 2021-01-16 ENCOUNTER — Other Ambulatory Visit: Payer: Self-pay

## 2021-01-16 ENCOUNTER — Encounter: Payer: Self-pay | Admitting: Cardiology

## 2021-01-16 ENCOUNTER — Ambulatory Visit: Payer: Medicare Other | Admitting: Cardiology

## 2021-01-16 VITALS — BP 168/80 | HR 76 | Ht 64.0 in | Wt 122.0 lb

## 2021-01-16 DIAGNOSIS — I10 Essential (primary) hypertension: Secondary | ICD-10-CM

## 2021-01-16 DIAGNOSIS — E782 Mixed hyperlipidemia: Secondary | ICD-10-CM | POA: Diagnosis not present

## 2021-01-16 DIAGNOSIS — I351 Nonrheumatic aortic (valve) insufficiency: Secondary | ICD-10-CM

## 2021-01-16 DIAGNOSIS — I4819 Other persistent atrial fibrillation: Secondary | ICD-10-CM | POA: Diagnosis not present

## 2021-01-16 NOTE — Patient Instructions (Addendum)
Medication Instructions: Your physician has recommended you make the following change in your medication:  DECREASE AMIODARONE TO 100MG  ONCE DAILY   *If you need a refill on your cardiac medications before your next appointment, please call your pharmacy*   Lab Work:Your physician recommends that you return for lab work in: [place] BMET, TSH , CBC AND LIVER FUNCTION TEST   If you have labs (blood work) drawn today and your tests are completely normal, you will receive your results only by: [place] (if you have MyChart) OR A paper copy in the mail If you have any lab test that is abnormal or we need to change your treatment, we will call you to review the results.   Testing/Procedures: NONE   Follow-Up: At [facility], you and your health needs are our priority.  As part of our continuing mission to provide you with exceptional heart care, we have created designated Provider Care Teams.  These Care Teams include your primary Cardiologist (physician) and Advanced Practice Providers (APPs -  Physician Assistants and Nurse Practitioners) who all work together to provide you with the care you need, when you need it.  We recommend signing up for the patient portal called "MyChart".  Sign up information is provided on this After Visit Summary.  MyChart is used to connect with patients for Virtual Visits (Telemedicine).  Patients are able to view lab/test results, encounter notes, upcoming appointments, etc.  Non-urgent messages can be sent to your provider as well.   To learn more about what you can do with MyChart, go to [email or web address].    Your next appointment:   3 month(s)  The format for your next appointment:   In Person  Provider:   Jyl Heinz, MD    Other Instructions

## 2021-01-16 NOTE — Progress Notes (Signed)
Cardiology Office Note:    Date:  01/16/2021   ID:  Latasha Barber, DOB [birth year], MRN [id number]  PCP:  Raina Mina., MD  Cardiologist:  Jenean Lindau, MD   Referring MD: Raina Mina., MD    ASSESSMENT:    1. Persistent atrial fibrillation ([place])   2. Essential hypertension   3. Mixed hyperlipidemia   4. Moderate aortic regurgitation    PLAN:    In order of problems listed above:  Primary prevention stressed with the patient.  Importance of compliance with diet medication stressed and she vocalized understanding. Paroxysmal atrial fibrillation:I discussed with the patient atrial fibrillation, disease process. Management and therapy including rate and rhythm control, anticoagulation benefits and potential risks were discussed extensively with the patient. Patient had multiple questions which were answered to patient's satisfaction.  Patient is on amiodarone therapy and benefits and potential is explained and she vocalized understanding.  I told her to cut the dose to 200 mg daily and she is agreeable.  She will have complete blood work today including TSH as her thyroid was abnormal in the hospital. Essential hypertension: Blood pressure stable and diet was emphasized. Mild to moderate aortic regurgitation: Stable at this time and medical management. Patient will be seen in follow-up appointment in 6 months or earlier if the patient has any concerns    Medication Adjustments/Labs and Tests Ordered: Current medicines are reviewed at length with the patient today.  Concerns regarding medicines are outlined above.  No orders of the defined types were placed in this encounter.  No orders of the defined types were placed in this encounter.    No chief complaint on file.    History of Present Illness:    Latasha Barber is a 87 y.o. female.  Patient has past medical history of persistent atrial fibrillation.  She went to [facility] hospital with elevated heart rate.  She  tells me that she had COVID-19 infection and has recovered.  She underwent cardioversion and subsequently has done fine.  No chest pain orthopnea or PND.  She feels energetic back again.  She has been initiated on amiodarone therapy.  At the time of my evaluation, the patient is alert awake oriented and in no distress.  Past Medical History:  Diagnosis Date   Abnormal PFT 01/24/2018   Acquired hypothyroidism 04/14/2015   Last Assessment & Plan:  Update her TSH for her today and refill her med for her through the mail order   AF (paroxysmal atrial fibrillation) ([place]) 10/28/2014   Anemia 04/14/2015   Last Assessment & Plan:  Update her CBC and ensure that this is stable for her   Anticoagulant long-term use 11/19/2016   Atrial fibrillation (HCC)    Chronic kidney disease, stage III (moderate) ([place]) 01/24/2018   Closed fracture of left distal radius 12/05/2020   Dyslipidemia 10/28/2014   Elevated brain natriuretic peptide (BNP) level 12/05/2020   Essential hypertension 10/28/2014   Last Assessment & Plan:  Formatting of this note might be different from the original. This was improved for her on the repeat and she is doing well   Hyperglycemia 04/14/2015   Last Assessment & Plan:  She is doing well with her diet and she is going to have labs drawn for her sugar overall   Hyperlipemia    Hypertension    Leg skin lesion, right 05/20/2020   Leiomyoma of uterus 04/14/2015   Long-term use of high-risk medication 04/14/2015   Mixed hyperlipidemia 04/14/2015   Last  Assessment & Plan:  Update the lipids for her today and she is going to have this done   Moderate aortic regurgitation 12/05/2020   Osteopenia 03/22/2016   2012: -1.2   Persistent atrial fibrillation ([place]) 12/05/2020   Right carotid bruit 12/05/2020    Past Surgical History:  Procedure Laterality Date   APPENDECTOMY     BREAST LUMPECTOMY     HAND SURGERY     KNEE SURGERY     TONSILLECTOMY      Current Medications: Current Meds   Medication Sig   amiodarone (PACERONE) 200 MG tablet Take 100 mg by mouth daily.   apixaban (ELIQUIS) 5 MG TABS tablet Take 2.5 mg by mouth 2 (two) times daily.   b complex vitamins capsule Take 1 capsule by mouth daily. Unknown strength   Calcium Carbonate-Vitamin D 600-400 MG-UNIT per tablet Take 1 tablet by mouth 2 (two) times daily.   ferrous sulfate 325 (65 FE) MG tablet Take 1 tablet by mouth daily.   levothyroxine (SYNTHROID) 100 MCG tablet Take 100 mcg by mouth every morning.   lisinopril-hydrochlorothiazide (PRINZIDE,ZESTORETIC) 20-12.5 MG per tablet Take 1 tablet by mouth daily.   lovastatin (MEVACOR) 40 MG tablet Take 40 mg by mouth at bedtime.   metoprolol succinate (TOPROL-XL) 25 MG 24 hr tablet Take 25 mg by mouth daily.   [DISCONTINUED] amiodarone (PACERONE) 100 MG tablet Take 100 mg by mouth daily.     Allergies:   Patient has no known allergies.   Social History   Socioeconomic History   Marital status: Widowed    Spouse name: Not on file   Number of children: Not on file   Years of education: Not on file   Highest education level: Not on file  Occupational History   Occupation: Part Time [employer]    Comment: [employer]  Tobacco Use   Smoking status: Never   Smokeless tobacco: Never  Substance and Sexual Activity   Alcohol use: No    Alcohol/week: 0.0 standard drinks   Drug use: No   Sexual activity: Not on file  Other Topics Concern   Not on file  Social History Narrative   Not on file   Social Determinants of Health   Financial Resource Strain: Not on file  Food Insecurity: Not on file  Transportation Needs: Not on file  Physical Activity: Not on file  Stress: Not on file  Social Connections: Not on file     Family History: The patient's family history includes Alzheimer's disease in her sister; Breast cancer in her daughter; COPD in her brother and sister; Cancer in her mother; Multiple myeloma in her sister.  ROS:   Please see  the history of present illness.    All other systems reviewed and are negative.  EKGs/Labs/Other Studies Reviewed:    The following studies were reviewed today: I discussed my findings with the patient at length   Recent Labs: 10/14/2020: Hemoglobin 13.1; Platelets 213; TSH 1.370 12/05/2020: ALT 23; BUN 15; Creatinine, Ser 1.03; Potassium 3.9; Sodium 141  Recent Lipid Panel    Component Value Date/Time   CHOL 199 10/14/2020 0936   TRIG 111 10/14/2020 0936   HDL 64 10/14/2020 0936   CHOLHDL 3.1 10/14/2020 0936   LDLCALC 115 (H) 10/14/2020 0936    Physical Exam:    VS:  BP (!) 168/80   Pulse 76   Ht '5\' 4"'  (1.626 m)   Wt 122 lb (55.3 kg)   SpO2 98%  BMI 20.94 kg/m     Wt Readings from Last 3 Encounters:  01/16/21 122 lb (55.3 kg)  12/05/20 121 lb 3.2 oz (55 kg)  10/14/20 122 lb 9.6 oz (55.6 kg)     GEN: Patient is in no acute distress HEENT: Normal NECK: No JVD; No carotid bruits LYMPHATICS: No lymphadenopathy CARDIAC: Hear sounds regular, 2/6 systolic murmur at the apex. RESPIRATORY:  Clear to auscultation without rales, wheezing or rhonchi  ABDOMEN: Soft, non-tender, non-distended MUSCULOSKELETAL:  No edema; No deformity  SKIN: Warm and dry NEUROLOGIC:  Alert and oriented x 3 PSYCHIATRIC:  Normal affect   Signed, Jenean Lindau, MD  01/16/2021 11:41 AM    [place]

## 2021-01-17 LAB — TSH: TSH: 1.64 u[IU]/mL (ref 0.450–4.500)

## 2021-01-17 LAB — HEPATIC FUNCTION PANEL
ALT: 15 IU/L (ref 0–32)
AST: 25 IU/L (ref 0–40)
Albumin: 4.3 g/dL (ref 3.5–4.6)
Alkaline Phosphatase: 80 IU/L (ref 44–121)
Bilirubin Total: 0.5 mg/dL (ref 0.0–1.2)
Bilirubin, Direct: 0.14 mg/dL (ref 0.00–0.40)
Total Protein: 6.8 g/dL (ref 6.0–8.5)

## 2021-01-17 LAB — CBC
Hematocrit: 39 % (ref 34.0–46.6)
Hemoglobin: 12.9 g/dL (ref 11.1–15.9)
MCH: 29.1 pg (ref 26.6–33.0)
MCHC: 33.1 g/dL (ref 31.5–35.7)
MCV: 88 fL (ref 79–97)
Platelets: 242 10*3/uL (ref 150–450)
RBC: 4.43 x10E6/uL (ref 3.77–5.28)
RDW: 13.7 % (ref 11.7–15.4)
WBC: 8.2 10*3/uL (ref 3.4–10.8)

## 2021-01-17 LAB — BASIC METABOLIC PANEL
BUN/Creatinine Ratio: 15 (ref 12–28)
BUN: 14 mg/dL (ref 10–36)
CO2: 25 mmol/L (ref 20–29)
Calcium: 9.1 mg/dL (ref 8.7–10.3)
Chloride: 103 mmol/L (ref 96–106)
Creatinine, Ser: 0.93 mg/dL (ref 0.57–1.00)
Glucose: 91 mg/dL (ref 70–99)
Potassium: 4 mmol/L (ref 3.5–5.2)
Sodium: 141 mmol/L (ref 134–144)
eGFR: 58 mL/min/{1.73_m2} — ABNORMAL LOW (ref >59)

## 2021-05-04 ENCOUNTER — Encounter: Payer: Self-pay | Admitting: Cardiology

## 2021-05-04 ENCOUNTER — Other Ambulatory Visit: Payer: Self-pay

## 2021-05-04 ENCOUNTER — Ambulatory Visit: Payer: Medicare Other | Admitting: Cardiology

## 2021-05-04 VITALS — BP 174/82 | HR 82 | Ht 63.0 in | Wt 120.6 lb

## 2021-05-04 DIAGNOSIS — E782 Mixed hyperlipidemia: Secondary | ICD-10-CM | POA: Diagnosis not present

## 2021-05-04 DIAGNOSIS — E559 Vitamin D deficiency, unspecified: Secondary | ICD-10-CM

## 2021-05-04 DIAGNOSIS — N289 Disorder of kidney and ureter, unspecified: Secondary | ICD-10-CM

## 2021-05-04 DIAGNOSIS — I351 Nonrheumatic aortic (valve) insufficiency: Secondary | ICD-10-CM

## 2021-05-04 DIAGNOSIS — I4819 Other persistent atrial fibrillation: Secondary | ICD-10-CM

## 2021-05-04 DIAGNOSIS — I10 Essential (primary) hypertension: Secondary | ICD-10-CM

## 2021-05-04 MED ORDER — APIXABAN 2.5 MG PO TABS: 2.5000 mg | ORAL_TABLET | Freq: Two times a day (BID) | ORAL | 12 refills | Status: DC

## 2021-05-04 NOTE — Patient Instructions (Signed)
Medication Instructions:  ?Your physician has recommended you make the following change in your medication:  ? ?Stop Pacerone (Amiodarone) ? ?Your Eliquis dose is 2.5 mg twice daily. Your new RX will be for a 2.5 mg tablet. ? ?*If you need a refill on your cardiac medications before your next appointment, please call your pharmacy* ? ? ?Lab Work: ?Your physician recommends that you have labs done in the office today. Your test included  basic metabolic panel, complete blood count, vitamin D, liver function and lipids. ? ?If you have labs (blood work) drawn today and your tests are completely normal, you will receive your results only by: ?MyChart Message (if you have MyChart) OR ?A paper copy in the mail ?If you have any lab test that is abnormal or we need to change your treatment, we will call you to review the results. ? ? ?Testing/Procedures: ?Your physician has requested that you have an echocardiogram. Echocardiography is a painless test that uses sound waves to create images of your heart. It provides your doctor with information about the size and shape of your heart and how well your heart?s chambers and valves are working. This procedure takes approximately one hour. There are no restrictions for this procedure. ? ? ? ?Follow-Up: ?At [facility], you and your health needs are our priority.  As part of our continuing mission to provide you with exceptional heart care, we have created designated Provider Care Teams.  These Care Teams include your primary Cardiologist (physician) and Advanced Practice Providers (APPs -  Physician Assistants and Nurse Practitioners) who all work together to provide you with the care you need, when you need it. ? ?We recommend signing up for the patient portal called "MyChart".  Sign up information is provided on this After Visit Summary.  MyChart is used to connect with patients for Virtual Visits (Telemedicine).  Patients are able to view lab/test results, encounter notes,  upcoming appointments, etc.  Non-urgent messages can be sent to your provider as well.   ?To learn more about what you can do with MyChart, go to [email or web address].   ? ?Your next appointment:   ?9 month(s) ? ?The format for your next appointment:   ?In Person ? ?Provider:   ?Jyl Heinz, MD ? ? ?Other Instructions ?Echocardiogram ?An echocardiogram is a test that uses sound waves (ultrasound) to produce images of the heart. ?Images from an echocardiogram can provide important information about: ?Heart size and shape. ?The size and thickness and movement of your heart's walls. ?Heart muscle function and strength. ?Heart valve function or if you have stenosis. Stenosis is when the heart valves are too narrow. ?If blood is flowing backward through the heart valves (regurgitation). ?A tumor or infectious growth around the heart valves. ?Areas of heart muscle that are not working well because of poor blood flow or injury from a heart attack. ?Aneurysm detection. An aneurysm is a weak or damaged part of an artery wall. The wall bulges out from the normal force of blood pumping through the body. ?Tell a health care provider about: ?Any allergies you have. ?All medicines you are taking, including vitamins, herbs, eye drops, creams, and over-the-counter medicines. ?Any blood disorders you have. ?Any surgeries you have had. ?Any medical conditions you have. ?Whether you are pregnant or may be pregnant. ?What are the risks? ?Generally, this is a safe test. However, problems may occur, including an allergic reaction to dye (contrast) that may be used during the test. ?What happens before the test? ?  No specific preparation is needed. You may eat and drink normally. ?What happens during the test? ?You will take off your clothes from the waist up and put on a hospital gown. ?Electrodes or electrocardiogram (ECG)patches may be placed on your chest. The electrodes or patches are then connected to a device that monitors  your heart rate and rhythm. ?You will lie down on a table for an ultrasound exam. A gel will be applied to your chest to help sound waves pass through your skin. ?A handheld device, called a transducer, will be pressed against your chest and moved over your heart. The transducer produces sound waves that travel to your heart and bounce back (or "echo" back) to the transducer. These sound waves will be captured in real-time and changed into images of your heart that can be viewed on a video monitor. The images will be recorded on a computer and reviewed by your health care provider. ?You may be asked to change positions or hold your breath for a short time. This makes it easier to get different views or better views of your heart. ?In some cases, you may receive contrast through an IV in one of your veins. This can improve the quality of the pictures from your heart. ?The procedure may vary among health care providers and hospitals.   ?What can I expect after the test? ?You may return to your normal, everyday life, including diet, activities, and medicines, unless your health care provider tells you not to do that. ?Follow these instructions at home: ?It is up to you to get the results of your test. Ask your health care provider, or the department that is doing the test, when your results will be ready. ?Keep all follow-up visits. This is important. ?Summary ?An echocardiogram is a test that uses sound waves (ultrasound) to produce images of the heart. ?Images from an echocardiogram can provide important information about the size and shape of your heart, heart muscle function, heart valve function, and other possible heart problems. ?You do not need to do anything to prepare before this test. You may eat and drink normally. ?After the echocardiogram is completed, you may return to your normal, everyday life, unless your health care provider tells you not to do that. ?This information is not intended to replace  advice given to you by your health care provider. Make sure you discuss any questions you have with your health care provider. ?Document Revised: 10/09/2019 Document Reviewed: 10/09/2019 ?Elsevier Patient Education ? [place]. ? ? ?

## 2021-05-04 NOTE — Progress Notes (Signed)
?Cardiology Office Note:   ? ?Date:  05/04/2021  ? ?ID:  Latasha Barber, DOB [birth year], MRN [id number] ? ?PCP:  Raina Mina., MD  ?Cardiologist:  Jenean Lindau, MD  ? ?Referring MD: Raina Mina., MD  ? ? ?ASSESSMENT:   ? ?1. Essential hypertension   ?2. Mixed hyperlipidemia   ?3. Moderate aortic regurgitation   ?4. Persistent atrial fibrillation ([place])   ? ?PLAN:   ? ?In order of problems listed above: ? ?Primary prevention stressed with the patient.  Importance of compliance with diet medication stressed that she vocalized understanding. ?Permanent atrial fibrillation:I discussed with the patient atrial fibrillation, disease process. Management and therapy including rate and rhythm control, anticoagulation benefits and potential risks were discussed extensively with the patient. Patient had multiple questions which were answered to patient's satisfaction.  She tolerates atrial fibrillation well.  Her left atrium is severely enlarged by echo in 2022.  I will discontinue amiodarone at this time.  This was discussed with her and her daughter and questions were answered to their satisfaction.  I think she is in persistent atrial fibrillation and no reason for [place] to continue amiodarone. ?Mixed dyslipidemia: Lipids were reviewed and she is fasting and she will have blood work done today. ?Essential hypertension: She has an element of whitecoat hypertension.  She keeps good track of her blood pressures at home and I reviewed them and they are fine. ?Patient will be seen in follow-up appointment in 9 months or earlier if the patient has any concerns ? ? ? ?Medication Adjustments/Labs and Tests Ordered: ?Current medicines are reviewed at length with the patient today.  Concerns regarding medicines are outlined above.  ?No orders of the defined types were placed in this encounter. ? ?No orders of the defined types were placed in this encounter. ? ? ? ?No chief complaint on file. ?  ? ?History of Present Illness:    ? ?Latasha Barber is a 88 y.o. female.  Patient has past medical history of permanent atrial fibrillation, essential hypertension, mixed dyslipidemia, severe left atrial enlargement.  She denies any problems at this time and takes care of activities of daily living.  No chest pain orthopnea or PND.  At the time of my evaluation, the patient is alert awake oriented and in no distress. ? ?Past Medical History:  ?Diagnosis Date  ? Abnormal PFT 01/24/2018  ? Acquired hypothyroidism 04/14/2015  ? Last Assessment & Plan:  Update her TSH for her today and refill her med for her through the mail order  ? AF (paroxysmal atrial fibrillation) ([place]) 10/28/2014  ? Anemia 04/14/2015  ? Last Assessment & Plan:  Update her CBC and ensure that this is stable for her  ? Anticoagulant long-term use 11/19/2016  ? Atrial fibrillation ([place])   ? Chronic kidney disease, stage III (moderate) ([place]) 01/24/2018  ? Closed fracture of left distal radius 12/05/2020  ? Dyslipidemia 10/28/2014  ? Elevated brain natriuretic peptide (BNP) level 12/05/2020  ? Essential hypertension 10/28/2014  ? Last Assessment & Plan:  Formatting of this note might be different from the original. This was improved for her on the repeat and she is doing well  ? Hyperglycemia 04/14/2015  ? Last Assessment & Plan:  She is doing well with her diet and she is going to have labs drawn for her sugar overall  ? Hyperlipemia   ? Hypertension   ? Leg skin lesion, right 05/20/2020  ? Leiomyoma of uterus 04/14/2015  ? Long-term use of  high-risk medication 04/14/2015  ? Mixed hyperlipidemia 04/14/2015  ? Last Assessment & Plan:  Update the lipids for her today and she is going to have this done  ? Moderate aortic regurgitation 12/05/2020  ? Osteopenia 03/22/2016  ? 2012: -1.2  ? Persistent atrial fibrillation ([place]) 12/05/2020  ? Right carotid bruit 12/05/2020  ? ? ?Past Surgical History:  ?Procedure Laterality Date  ? APPENDECTOMY    ? BREAST LUMPECTOMY    ? HAND SURGERY    ? KNEE  SURGERY    ? TONSILLECTOMY    ? ? ?Current Medications: ?Current Meds  ?Medication Sig  ? amiodarone (PACERONE) 200 MG tablet Take 100 mg by mouth daily.  ? apixaban (ELIQUIS) 5 MG TABS tablet Take 2.5 mg by mouth 2 (two) times daily.  ? b complex vitamins capsule Take 1 capsule by mouth daily. Unknown strength  ? Calcium Carbonate-Vitamin D 600-400 MG-UNIT per tablet Take 1 tablet by mouth 2 (two) times daily.  ? ferrous sulfate 325 (65 FE) MG tablet Take 1 tablet by mouth daily.  ? levothyroxine (SYNTHROID) 100 MCG tablet Take 100 mcg by mouth every morning.  ? lisinopril-hydrochlorothiazide (PRINZIDE,ZESTORETIC) 20-12.5 MG per tablet Take 1 tablet by mouth daily.  ? lovastatin (MEVACOR) 40 MG tablet Take 40 mg by mouth at bedtime.  ? metoprolol succinate (TOPROL-XL) 25 MG 24 hr tablet Take 25 mg by mouth daily.  ?  ? ?Allergies:   Patient has no known allergies.  ? ?Social History  ? ?Socioeconomic History  ? Marital status: Widowed  ?  Spouse name: Not on file  ? Number of children: Not on file  ? Years of education: Not on file  ? Highest education level: Not on file  ?Occupational History  ? Occupation: Part Time [employer]  ?  Comment: [employer]  ?Tobacco Use  ? Smoking status: Never  ? Smokeless tobacco: Never  ?Substance and Sexual Activity  ? Alcohol use: No  ?  Alcohol/week: 0.0 standard drinks  ? Drug use: No  ? Sexual activity: Not on file  ?Other Topics Concern  ? Not on file  ?Social History Narrative  ? Not on file  ? ?Social Determinants of Health  ? ?Financial Resource Strain: Not on file  ?Food Insecurity: Not on file  ?Transportation Needs: Not on file  ?Physical Activity: Not on file  ?Stress: Not on file  ?Social Connections: Not on file  ?  ? ?Family History: ?The patient's family history includes Alzheimer's disease in her sister; Breast cancer in her daughter; COPD in her brother and sister; Cancer in her mother; Multiple myeloma in her sister. ? ?ROS:   ?Please see the history of  present illness.    ?All other systems reviewed and are negative. ? ?EKGs/Labs/Other Studies Reviewed:   ? ?The following studies were reviewed today: ?EKG reveals atrial fibrillation with well-controlled ventricular rate. ? ? ?Recent Labs: ?01/16/2021: ALT 15; BUN 14; Creatinine, Ser 0.93; Hemoglobin 12.9; Platelets 242; Potassium 4.0; Sodium 141; TSH 1.640  ?Recent Lipid Panel ?   ?Component Value Date/Time  ? CHOL 199 10/14/2020 0936  ? TRIG 111 10/14/2020 0936  ? HDL 64 10/14/2020 0936  ? CHOLHDL 3.1 10/14/2020 0936  ? [place] 115 (H) 10/14/2020 0936  ? ? ?Physical Exam:   ? ?VS:  BP (!) 174/82   Pulse 82   Ht _0  (1.6 m)   Wt 120 lb 9.6 oz (54.7 kg)   SpO2 99%   BMI 21.36 kg/m?    ? ?  Wt Readings from Last 3 Encounters:  ?05/04/21 120 lb 9.6 oz (54.7 kg)  ?01/16/21 122 lb (55.3 kg)  ?12/05/20 121 lb 3.2 oz (55 kg)  ?  ? ?GEN: Patient is in no acute distress ?HEENT: Normal ?NECK: No JVD; No carotid bruits ?LYMPHATICS: No lymphadenopathy ?CARDIAC: Hear sounds regular, 2/6 systolic murmur at the apex. ?RESPIRATORY:  Clear to auscultation without rales, wheezing or rhonchi  ?ABDOMEN: Soft, non-tender, non-distended ?MUSCULOSKELETAL:  No edema; No deformity  ?SKIN: Warm and dry ?NEUROLOGIC:  Alert and oriented x 3 ?PSYCHIATRIC:  Normal affect  ? ?Signed, ?Jenean Lindau, MD  ?05/04/2021 9:38 AM    ?[place]  ?

## 2021-05-05 LAB — CBC WITH DIFFERENTIAL/PLATELET
Basophils Absolute: 0.1 10*3/uL (ref 0.0–0.2)
Basos: 1 %
EOS (ABSOLUTE): 0.1 10*3/uL (ref 0.0–0.4)
Eos: 2 %
Hematocrit: 38.6 % (ref 34.0–46.6)
Hemoglobin: 13.1 g/dL (ref 11.1–15.9)
Immature Grans (Abs): 0 10*3/uL (ref 0.0–0.1)
Immature Granulocytes: 0 %
Lymphocytes Absolute: 1.7 10*3/uL (ref 0.7–3.1)
Lymphs: 27 %
MCH: 29 pg (ref 26.6–33.0)
MCHC: 33.9 g/dL (ref 31.5–35.7)
MCV: 85 fL (ref 79–97)
Monocytes Absolute: 0.6 10*3/uL (ref 0.1–0.9)
Monocytes: 9 %
Neutrophils Absolute: 4 10*3/uL (ref 1.4–7.0)
Neutrophils: 61 %
Platelets: 228 10*3/uL (ref 150–450)
RBC: 4.52 x10E6/uL (ref 3.77–5.28)
RDW: 13.1 % (ref 11.7–15.4)
WBC: 6.5 10*3/uL (ref 3.4–10.8)

## 2021-05-05 LAB — LIPID PANEL
Chol/HDL Ratio: 3.1 ratio (ref 0.0–4.4)
Cholesterol, Total: 162 mg/dL (ref 100–199)
HDL: 52 mg/dL (ref >39)
LDL Chol Calc (NIH): 92 mg/dL (ref 0–99)
Triglycerides: 99 mg/dL (ref 0–149)
VLDL Cholesterol Cal: 18 mg/dL (ref 5–40)

## 2021-05-05 LAB — BASIC METABOLIC PANEL
BUN/Creatinine Ratio: 13 (ref 12–28)
BUN: 17 mg/dL (ref 10–36)
CO2: 25 mmol/L (ref 20–29)
Calcium: 8.9 mg/dL (ref 8.7–10.3)
Chloride: 104 mmol/L (ref 96–106)
Creatinine, Ser: 1.27 mg/dL — ABNORMAL HIGH (ref 0.57–1.00)
Glucose: 100 mg/dL — ABNORMAL HIGH (ref 70–99)
Potassium: 4.3 mmol/L (ref 3.5–5.2)
Sodium: 143 mmol/L (ref 134–144)
eGFR: 40 mL/min/{1.73_m2} — ABNORMAL LOW (ref >59)

## 2021-05-05 LAB — VITAMIN D 25 HYDROXY (VIT D DEFICIENCY, FRACTURES): Vit D, 25-Hydroxy: 61.6 ng/mL (ref 30.0–100.0)

## 2021-05-05 LAB — HEPATIC FUNCTION PANEL
ALT: 11 IU/L (ref 0–32)
AST: 21 IU/L (ref 0–40)
Albumin: 4.2 g/dL (ref 3.5–4.6)
Alkaline Phosphatase: 96 IU/L (ref 44–121)
Bilirubin Total: 0.5 mg/dL (ref 0.0–1.2)
Bilirubin, Direct: 0.15 mg/dL (ref 0.00–0.40)
Total Protein: 6.7 g/dL (ref 6.0–8.5)

## 2021-05-05 NOTE — Addendum Note (Signed)
Addended by: Truddie Hidden on: 05/05/2021 10:37 AM ? ? Modules accepted: Orders ? ?

## 2021-05-08 ENCOUNTER — Ambulatory Visit (INDEPENDENT_AMBULATORY_CARE_PROVIDER_SITE_OTHER): Payer: Medicare Other

## 2021-05-08 ENCOUNTER — Other Ambulatory Visit: Payer: Self-pay

## 2021-05-08 DIAGNOSIS — I351 Nonrheumatic aortic (valve) insufficiency: Secondary | ICD-10-CM

## 2021-05-08 LAB — ECHOCARDIOGRAM COMPLETE
Area-P 1/2: 4.89 cm2
MV M vel: 4.84 m/s
MV Peak grad: 93.7 mmHg
P 1/2 time: 343 msec
S' Lateral: 3.5 cm

## 2021-06-03 LAB — BASIC METABOLIC PANEL
BUN/Creatinine Ratio: 12 (ref 12–28)
BUN: 14 mg/dL (ref 10–36)
CO2: 25 mmol/L (ref 20–29)
Calcium: 9.5 mg/dL (ref 8.7–10.3)
Chloride: 104 mmol/L (ref 96–106)
Creatinine, Ser: 1.21 mg/dL — ABNORMAL HIGH (ref 0.57–1.00)
Glucose: 105 mg/dL — ABNORMAL HIGH (ref 70–99)
Potassium: 4.2 mmol/L (ref 3.5–5.2)
Sodium: 142 mmol/L (ref 134–144)
eGFR: 43 mL/min/{1.73_m2} — ABNORMAL LOW (ref >59)

## 2021-06-05 ENCOUNTER — Telehealth: Payer: Self-pay | Admitting: Cardiology

## 2021-06-05 NOTE — Telephone Encounter (Signed)
Pt's daughter calling to get clarification on test results. Please advise ?

## 2021-06-05 NOTE — Telephone Encounter (Signed)
Discussed lab results with dgt  per DPR and answered all questions. ?

## 2021-08-18 DIAGNOSIS — R42 Dizziness and giddiness: Secondary | ICD-10-CM

## 2021-08-18 DIAGNOSIS — R531 Weakness: Secondary | ICD-10-CM | POA: Insufficient documentation

## 2021-08-18 DIAGNOSIS — R0602 Shortness of breath: Secondary | ICD-10-CM

## 2021-09-07 DIAGNOSIS — F320 Major depressive disorder, single episode, mild: Secondary | ICD-10-CM | POA: Insufficient documentation

## 2021-09-07 HISTORY — DX: Major depressive disorder, single episode, mild: F32.0

## 2021-09-08 ENCOUNTER — Encounter: Payer: Self-pay | Admitting: Cardiology

## 2021-09-08 ENCOUNTER — Encounter (HOSPITAL_COMMUNITY): Payer: Self-pay | Admitting: *Deleted

## 2021-09-08 ENCOUNTER — Ambulatory Visit: Payer: Medicare Other | Admitting: Cardiology

## 2021-09-08 ENCOUNTER — Telehealth (HOSPITAL_COMMUNITY): Payer: Self-pay | Admitting: *Deleted

## 2021-09-08 VITALS — BP 179/74 | HR 85 | Ht 65.0 in | Wt 117.4 lb

## 2021-09-08 DIAGNOSIS — I10 Essential (primary) hypertension: Secondary | ICD-10-CM | POA: Diagnosis not present

## 2021-09-08 DIAGNOSIS — I4819 Other persistent atrial fibrillation: Secondary | ICD-10-CM | POA: Diagnosis not present

## 2021-09-08 DIAGNOSIS — R0609 Other forms of dyspnea: Secondary | ICD-10-CM

## 2021-09-08 HISTORY — DX: Other forms of dyspnea: R06.09

## 2021-09-08 MED ORDER — AMIODARONE HCL 100 MG PO TABS: 100.0000 mg | ORAL_TABLET | Freq: Every day | ORAL | 3 refills | Status: DC

## 2021-09-08 NOTE — Telephone Encounter (Signed)
My chart letter sent outlining instructions for upcoming stress test on 09/10/21.

## 2021-09-08 NOTE — Progress Notes (Signed)
Cardiology Office Note:    Date:  09/08/2021   ID:  Latasha Barber, DOB 1928/10/13, MRN [id number]  PCP:  Latasha Barber., MD  Cardiologist:  Jenean Lindau, MD   Referring MD: Latasha Barber., MD    ASSESSMENT:    1. Essential hypertension   2. Persistent atrial fibrillation ([place])   3. Primary hypertension   4. DOE (dyspnea on exertion)    PLAN:    In order of problems listed above:  Primary prevention stressed with the patient.  Importance of compliance with diet and medication stressed and she vocalized understanding Essential hypertension: Blood pressure stable and diet was emphasized. Persistent atrial fibrillation: She is not taking amiodarone.  I have restarted her on 100 mg daily.  She understands importance and benefits risks. Dyspnea on exertion: I will do a Lexiscan sestamibi to rule out evidence of obstructive coronary artery disease.  Overall she is a good 89. Valvular heart disease with mild mitral stenosis, moderate mitral regurgitation and mild to moderate aortic regurgitation.  I discussed this with her at length and questions were answered to her satisfaction.  I will see her back in 3 weeks or so if her tests are negative and she is has dyspnea on exertion I may consider her for the possibility of an elective cardioversion.  I think the possibility of maintaining her successfully are not high because of significant left atrial dilatation.  I will discuss these issues with her at next appointment.  She had multiple questions which were answered to her satisfaction.   Medication Adjustments/Labs and Tests Ordered: Current medicines are reviewed at length with the patient today.  Concerns regarding medicines are outlined above.  No orders of the defined types were placed in this encounter.  No orders of the defined types were placed in this encounter.    No chief complaint on file.    History of Present Illness:    Latasha Barber is a 88 y.o. female.   Patient has past medical history of atrial fibrillation, essential hypertension and aortic regurgitation and mild mitral stenosis.  She denies any problems at this time except shortness of breath.  She went to the emergency room and I reviewed those records.  She had a CT scan which revealed no evidence of pulmonary embolus.  At the time of my evaluation, the patient is alert awake oriented and in no distress.  Past Medical History:  Diagnosis Date   Abnormal PFT 01/24/2018   Acquired hypothyroidism 04/14/2015   Last Assessment & Plan:  Update her TSH for her today and refill her med for her through the mail order   AF (paroxysmal atrial fibrillation) ([place]) 10/28/2014   Anemia 04/14/2015   Last Assessment & Plan:  Update her CBC and ensure that this is stable for her   Anticoagulant long-term use 11/19/2016   Atrial fibrillation (HCC)    Chronic kidney disease, stage III (moderate) ([place]) 01/24/2018   Closed fracture of left distal radius 12/05/2020   Dizziness of unknown etiology 08/18/2021   Dyslipidemia 10/28/2014   Elevated brain natriuretic peptide (BNP) level 12/05/2020   Essential hypertension 10/28/2014   Last Assessment & Plan:  Formatting of this note might be different from the original. This was improved for her on the repeat and she is doing well   Hyperglycemia 04/14/2015   Last Assessment & Plan:  She is doing well with her diet and she is going to have labs drawn for her sugar overall  Hyperlipemia    Hypertension    Leg skin lesion, right 05/20/2020   Leiomyoma of uterus 04/14/2015   Long-term use of high-risk medication 04/14/2015   Mixed hyperlipidemia 04/14/2015   Last Assessment & Plan:  Update the lipids for her today and she is going to have this done   Moderate aortic regurgitation 12/05/2020   Osteopenia 03/22/2016   2012: -1.2   Persistent atrial fibrillation ([place]) 12/05/2020   Right carotid bruit 12/05/2020   Shortness of breath 08/18/2021   Stage 3a chronic  kidney disease ([place]) 01/24/2018   Weakness 08/18/2021    Past Surgical History:  Procedure Laterality Date   APPENDECTOMY     BREAST LUMPECTOMY     HAND SURGERY     KNEE SURGERY     TONSILLECTOMY      Current Medications: Current Meds  Medication Sig   apixaban (ELIQUIS) 2.5 MG TABS tablet Take 1 tablet (2.5 mg total) by mouth 2 (two) times daily.   b complex vitamins capsule Take 1 capsule by mouth daily. Unknown strength   Calcium Carbonate-Vitamin D 600-400 MG-UNIT per tablet Take 1 tablet by mouth 2 (two) times daily.   ferrous sulfate 325 (65 FE) MG tablet Take 1 tablet by mouth daily.   levothyroxine (SYNTHROID) 100 MCG tablet Take 100 mcg by mouth every morning.   lisinopril-hydrochlorothiazide (PRINZIDE,ZESTORETIC) 20-12.5 MG per tablet Take 1 tablet by mouth daily.   lovastatin (MEVACOR) 40 MG tablet Take 40 mg by mouth at bedtime.   metoprolol succinate (TOPROL-XL) 25 MG 24 hr tablet Take 25 mg by mouth daily.     Allergies:   Patient has no known allergies.   Social History   Socioeconomic History   Marital status: Widowed    Spouse name: Not on file   Number of children: Not on file   Years of education: Not on file   Highest education level: Not on file  Occupational History   Occupation: Part Time [employer]    Comment: [employer]  Tobacco Use   Smoking status: Never   Smokeless tobacco: Never  Substance and Sexual Activity   Alcohol use: No    Alcohol/week: 0.0 standard drinks of alcohol   Drug use: No   Sexual activity: Not on file  Other Topics Concern   Not on file  Social History Narrative   Not on file   Social Determinants of Health   Financial Resource Strain: Not on file  Food Insecurity: Not on file  Transportation Needs: Not on file  Physical Activity: Not on file  Stress: Not on file  Social Connections: Not on file     Family History: The patient's family history includes Alzheimer's disease in her sister; Breast  cancer in her daughter; COPD in her brother and sister; Cancer in her mother; Multiple myeloma in her sister.  ROS:   Please see the history of present illness.    All other systems reviewed and are negative.  EKGs/Labs/Other Studies Reviewed:    The following studies were reviewed today: EKG reveals atrial fibrillation with well-controlled ventricular rate.   Recent Labs: 01/16/2021: TSH 1.640 05/04/2021: ALT 11; Hemoglobin 13.1; Platelets 228 06/02/2021: BUN 14; Creatinine, Ser 1.21; Potassium 4.2; Sodium 142  Recent Lipid Panel    Component Value Date/Time   CHOL 162 05/04/2021 0958   TRIG 99 05/04/2021 0958   HDL 52 05/04/2021 0958   CHOLHDL 3.1 05/04/2021 0958   LDLCALC 92 05/04/2021 0958    Physical Exam:  VS:  BP (!) 179/74   Pulse 85   Ht '5\' 5"'  (1.651 m)   Wt 117 lb 6.4 oz (53.3 kg)   BMI 19.54 kg/m     Wt Readings from Last 3 Encounters:  09/08/21 117 lb 6.4 oz (53.3 kg)  05/04/21 120 lb 9.6 oz (54.7 kg)  01/16/21 122 lb (55.3 kg)     GEN: Patient is in no acute distress HEENT: Normal NECK: No JVD; No carotid bruits LYMPHATICS: No lymphadenopathy CARDIAC: Hear sounds regular, 2/6 systolic murmur at the apex. RESPIRATORY:  Clear to auscultation without rales, wheezing or rhonchi  ABDOMEN: Soft, non-tender, non-distended MUSCULOSKELETAL:  No edema; No deformity  SKIN: Warm and dry NEUROLOGIC:  Alert and oriented x 3 PSYCHIATRIC:  Normal affect   Signed, Jenean Lindau, MD  09/08/2021 10:05 AM    [place]

## 2021-09-08 NOTE — Patient Instructions (Signed)
Medication Instructions:  Your physician has recommended you make the following change in your medication:   Start Amiodarone 100 mg daily.   *If you need a refill on your cardiac medications before your next appointment, please call your pharmacy*   Lab Work: None ordered If you have labs (blood work) drawn today and your tests are completely normal, you will receive your results only by: [place] (if you have MyChart) OR A paper copy in the mail If you have any lab test that is abnormal or we need to change your treatment, we will call you to review the results.   Testing/Procedures: Your physician has requested that you have a lexiscan myoview. For further information please visit [email or web address]. Please follow instruction sheet, as given.  The test will take approximately 3 to 4 hours to complete; you may bring reading material.  If someone comes with you to your appointment, they will need to remain in the main lobby due to limited space in the testing area. **If you are pregnant or breastfeeding, please notify the nuclear lab prior to your appointment**  How to prepare for your Myocardial Perfusion Test: Do not eat or drink 3 hours prior to your test, except you may have water. Do not consume products containing caffeine (regular or decaffeinated) 12 hours prior to your test. (ex: coffee, chocolate, sodas, tea). Do bring a list of your current medications with you.  If not listed below, you may take your medications as normal. Do wear comfortable clothes (no dresses or overalls) and walking shoes, tennis shoes preferred (No heels or open toe shoes are allowed). Do NOT wear cologne, perfume, aftershave, or lotions (deodorant is allowed). If these instructions are not followed, your test will have to be rescheduled.    Follow-Up: At [facility], you and your health needs are our priority.  As part of our continuing mission to provide you with exceptional heart care,  we have created designated Provider Care Teams.  These Care Teams include your primary Cardiologist (physician) and Advanced Practice Providers (APPs -  Physician Assistants and Nurse Practitioners) who all work together to provide you with the care you need, when you need it.  We recommend signing up for the patient portal called "MyChart".  Sign up information is provided on this After Visit Summary.  MyChart is used to connect with patients for Virtual Visits (Telemedicine).  Patients are able to view lab/test results, encounter notes, upcoming appointments, etc.  Non-urgent messages can be sent to your provider as well.   To learn more about what you can do with MyChart, go to [email or web address].    Your next appointment:   1st week of August  The format for your next appointment:   In Person  Provider:   Jyl Heinz, MD   Other Instructions Cardiac Nuclear Scan A cardiac nuclear scan is a test that is done to check the flow of blood to your heart. It is done when you are resting and when you are exercising. The test looks for problems such as: Not enough blood reaching a portion of the heart. The heart muscle not working as it should. You may need this test if: You have heart disease. You have had lab results that are not normal. You have had heart surgery or a balloon procedure to open up blocked arteries (angioplasty). You have chest pain. You have shortness of breath. In this test, a special dye (tracer) is put into your bloodstream. The tracer will travel to  your heart. A camera will then take pictures of your heart to see how the tracer moves through your heart. This test is usually done at a hospital and takes 2-4 hours. Tell a doctor about: Any allergies you have. All medicines you are taking, including vitamins, herbs, eye drops, creams, and over-the-counter medicines. Any problems you or family members have had with anesthetic medicines. Any blood disorders you  have. Any surgeries you have had. Any medical conditions you have. Whether you are pregnant or may be pregnant. What are the risks? Generally, this is a safe test. However, problems may occur, such as: Serious chest pain and heart attack. This is only a risk if the stress portion of the test is done. Rapid heartbeat. A feeling of warmth in your chest. This feeling usually does not last long. Allergic reaction to the tracer. What happens before the test? Ask your doctor about changing or stopping your normal medicines. This is important. Follow instructions from your doctor about what you cannot eat or drink. Remove your jewelry on the day of the test. What happens during the test? An IV tube will be inserted into one of your veins. Your doctor will give you a small amount of tracer through the IV tube. You will wait for 20-40 minutes while the tracer moves through your bloodstream. Your heart will be monitored with an electrocardiogram (ECG). You will lie down on an exam table. Pictures of your heart will be taken for about 15-20 minutes. You may also have a stress test. For this test, one of these things may be done: You will be asked to exercise on a treadmill or a stationary bike. You will be given medicines that will make your heart work harder. This is done if you are unable to exercise. When blood flow to your heart has peaked, a tracer will again be given through the IV tube. After 20-40 minutes, you will get back on the exam table. More pictures will be taken of your heart. Depending on the tracer that is used, more pictures may need to be taken 3-4 hours later. Your IV tube will be removed when the test is over. The test may vary among doctors and hospitals. What happens after the test? Ask your doctor: Whether you can return to your normal schedule, including diet, activities, and medicines. Whether you should drink more fluids. This will help to remove the tracer from your  body. Drink enough fluid to keep your pee (urine) pale yellow. Ask your doctor, or the department that is doing the test: When will my results be ready? How will I get my results? Summary A cardiac nuclear scan is a test that is done to check the flow of blood to your heart. Tell your doctor whether you are pregnant or may be pregnant. Before the test, ask your doctor about changing or stopping your normal medicines. This is important. Ask your doctor whether you can return to your normal activities. You may be asked to drink more fluids. This information is not intended to replace advice given to you by your health care provider. Make sure you discuss any questions you have with your health care provider. Document Revised: 06/07/2018 Document Reviewed: 08/01/2017 Elsevier Patient Education  [employer].

## 2021-09-09 NOTE — Addendum Note (Signed)
Addended by: Truddie Hidden on: 09/09/2021 01:43 PM   Modules accepted: Orders

## 2021-09-09 NOTE — Addendum Note (Signed)
Addended by: Jyl Heinz R on: 09/09/2021 01:46 PM   Modules accepted: Orders

## 2021-09-10 ENCOUNTER — Ambulatory Visit (INDEPENDENT_AMBULATORY_CARE_PROVIDER_SITE_OTHER): Payer: Medicare Other

## 2021-09-10 DIAGNOSIS — I4819 Other persistent atrial fibrillation: Secondary | ICD-10-CM | POA: Diagnosis not present

## 2021-09-10 DIAGNOSIS — R0609 Other forms of dyspnea: Secondary | ICD-10-CM | POA: Diagnosis not present

## 2021-09-10 LAB — MYOCARDIAL PERFUSION IMAGING
LV dias vol: 57 mL (ref 46–106)
LV sys vol: 22 mL
Nuc Stress EF: 61 %
Peak HR: 101 {beats}/min
Rest HR: 80 {beats}/min
Rest Nuclear Isotope Dose: 10.2 mCi
SDS: 2
SRS: 2
SSS: 3
Stress Nuclear Isotope Dose: 32 mCi
TID: 1.03

## 2021-09-10 MED ORDER — TECHNETIUM TC 99M TETROFOSMIN IV KIT
10.2000 | PACK | Freq: Once | INTRAVENOUS | Status: AC | PRN
  Administered 2021-09-10: 10.2 via INTRAVENOUS

## 2021-09-10 MED ORDER — REGADENOSON 0.4 MG/5ML IV SOLN
0.4000 mg | Freq: Once | INTRAVENOUS | Status: AC
  Administered 2021-09-10: 0.4 mg via INTRAVENOUS

## 2021-09-10 MED ORDER — TECHNETIUM TC 99M TETROFOSMIN IV KIT
32.0000 | PACK | Freq: Once | INTRAVENOUS | Status: AC | PRN
  Administered 2021-09-10: 32 via INTRAVENOUS

## 2021-09-24 ENCOUNTER — Telehealth: Payer: Self-pay | Admitting: Cardiology

## 2021-09-24 NOTE — Telephone Encounter (Signed)
Patient's son is requesting to speak with Dr. Geraldo Pitter or nurse regarding the medication amiodarone (PACERONE) 100 MG tablet

## 2021-09-24 NOTE — Telephone Encounter (Signed)
Pts son asked for call back regarding pts medications. He stated that the daughter was not helping the pt and he was having to take over her care. Advised that he should come with her to her upcoming appt and bring her medications and plan to fill out the forms so that we could speak with him about the pt. He agreed and verbalized understanding. He had no further questions.

## 2021-09-29 ENCOUNTER — Other Ambulatory Visit: Payer: Self-pay

## 2021-09-30 ENCOUNTER — Ambulatory Visit: Payer: Medicare Other | Admitting: Cardiology

## 2021-09-30 ENCOUNTER — Encounter: Payer: Self-pay | Admitting: Cardiology

## 2021-09-30 VITALS — BP 154/74 | HR 81 | Ht 65.0 in | Wt 117.8 lb

## 2021-09-30 DIAGNOSIS — I10 Essential (primary) hypertension: Secondary | ICD-10-CM | POA: Diagnosis not present

## 2021-09-30 DIAGNOSIS — I4819 Other persistent atrial fibrillation: Secondary | ICD-10-CM | POA: Diagnosis not present

## 2021-09-30 DIAGNOSIS — N1831 Chronic kidney disease, stage 3a: Secondary | ICD-10-CM

## 2021-09-30 DIAGNOSIS — E782 Mixed hyperlipidemia: Secondary | ICD-10-CM | POA: Diagnosis not present

## 2021-09-30 DIAGNOSIS — I351 Nonrheumatic aortic (valve) insufficiency: Secondary | ICD-10-CM

## 2021-09-30 NOTE — Progress Notes (Signed)
Cardiology Office Note:    Date:  09/30/2021   ID:  Latasha Barber, DOB 07/27/1928, MRN [id number]  PCP:  Raina Mina., MD  Cardiologist:  Jenean Lindau, MD   Referring MD: Raina Mina., MD    ASSESSMENT:    1. Persistent atrial fibrillation ([place])   2. Moderate aortic regurgitation   3. Mixed hyperlipidemia   4. Primary hypertension   5. Stage 3a chronic kidney disease (HCC)    PLAN:    In order of problems listed above:  Primary prevention stressed with the patient.  Importance of compliance with diet medication stressed and she vocalized understanding. Persistent atrial fibrillation:I discussed with the patient atrial fibrillation, disease process. Management and therapy including rate and rhythm control, anticoagulation benefits and potential risks were discussed extensively with the patient. Patient had multiple questions which were answered to patient's satisfaction.  She ran out of her Eliquis and has not been taking it for the past 2 days.  I counseled her against this.  We will give her samples today and she will refill her medications.  Her grandson was there for the visit and I discussed this with him also. Essential hypertension: Blood pressure stable and diet was emphasized.  Her blood pressures are better at home.  She has an element of whitecoat hypertension.  Also some of her friends are sick and she is concerned about it and overall her anxiety is elevated and her primary care is dealing with that. Valvular heart disease with aortic regurgitation and mitral stenosis.  Medical management. Patient will be seen in follow-up appointment in 6 months or earlier if the patient has any concerns    Medication Adjustments/Labs and Tests Ordered: Current medicines are reviewed at length with the patient today.  Concerns regarding medicines are outlined above.  No orders of the defined types were placed in this encounter.  No orders of the defined types were placed in  this encounter.    No chief complaint on file.    History of Present Illness:    Latasha Barber is a 88 y.o. female.  Patient has past medical history of essential hypertension, persistent atrial fibrillation, moderate aortic regurgitation, hyperlipidemia.  She denies any problems at this time and takes care of activities of daily living.  No chest pain orthopnea or PND.  At the time of my evaluation, the patient is alert awake oriented and in no distress.  Past Medical History:  Diagnosis Date   Abnormal PFT 01/24/2018   Acquired hypothyroidism 04/14/2015   Last Assessment & Plan:  Update her TSH for her today and refill her med for her through the mail order   AF (paroxysmal atrial fibrillation) ([place]) 10/28/2014   Anemia 04/14/2015   Last Assessment & Plan:  Update her CBC and ensure that this is stable for her   Anticoagulant long-term use 11/19/2016   Atrial fibrillation (HCC)    Chronic kidney disease, stage III (moderate) ([place]) 01/24/2018   Closed fracture of left distal radius 12/05/2020   Dizziness of unknown etiology 08/18/2021   DOE (dyspnea on exertion) 09/08/2021   Dyslipidemia 10/28/2014   Elevated brain natriuretic peptide (BNP) level 12/05/2020   Essential hypertension 10/28/2014   Last Assessment & Plan:  Formatting of this note might be different from the original. This was improved for her on the repeat and she is doing well   Hyperglycemia 04/14/2015   Last Assessment & Plan:  She is doing well with her diet and she is  going to have labs drawn for her sugar overall   Hyperlipemia    Hypertension    Leg skin lesion, right 05/20/2020   Leiomyoma of uterus 04/14/2015   Long-term use of high-risk medication 04/14/2015   Mild major depression ([place]) 09/07/2021   Mixed hyperlipidemia 04/14/2015   Last Assessment & Plan:  Update the lipids for her today and she is going to have this done   Moderate aortic regurgitation 12/05/2020   Osteopenia 03/22/2016   2012: -1.2    Persistent atrial fibrillation ([place]) 12/05/2020   Right carotid bruit 12/05/2020   Shortness of breath 08/18/2021   Stage 3a chronic kidney disease ([place]) 01/24/2018   Weakness 08/18/2021    Past Surgical History:  Procedure Laterality Date   APPENDECTOMY     BREAST LUMPECTOMY     HAND SURGERY     KNEE SURGERY     TONSILLECTOMY      Current Medications: Current Meds  Medication Sig   amiodarone (PACERONE) 100 MG tablet Take 1 tablet (100 mg total) by mouth daily.   b complex vitamins capsule Take 1 capsule by mouth daily. Unknown strength   busPIRone (BUSPAR) 5 MG tablet Take 5 mg by mouth 2 (two) times daily.   Calcium Carbonate-Vitamin D 600-400 MG-UNIT per tablet Take 1 tablet by mouth 2 (two) times daily.   escitalopram (LEXAPRO) 5 MG tablet Take 5 mg by mouth daily.   ferrous sulfate 325 (65 FE) MG tablet Take 1 tablet by mouth daily.   levothyroxine (SYNTHROID) 100 MCG tablet Take 100 mcg by mouth every morning.   lisinopril (ZESTRIL) 10 MG tablet Take 10 mg by mouth daily.   lovastatin (MEVACOR) 40 MG tablet Take 40 mg by mouth at bedtime.   metoprolol succinate (TOPROL-XL) 25 MG 24 hr tablet Take 25 mg by mouth daily.     Allergies:   Patient has no known allergies.   Social History   Socioeconomic History   Marital status: Widowed    Spouse name: Not on file   Number of children: Not on file   Years of education: Not on file   Highest education level: Not on file  Occupational History   Occupation: Part Time [employer]    Comment: [employer]  Tobacco Use   Smoking status: Never   Smokeless tobacco: Never  Substance and Sexual Activity   Alcohol use: No    Alcohol/week: 0.0 standard drinks of alcohol   Drug use: No   Sexual activity: Not on file  Other Topics Concern   Not on file  Social History Narrative   Not on file   Social Determinants of Health   Financial Resource Strain: Not on file  Food Insecurity: Not on file  Transportation Needs:  Not on file  Physical Activity: Not on file  Stress: Not on file  Social Connections: Not on file     Family History: The patient's family history includes Alzheimer's disease in her sister; Breast cancer in her daughter; COPD in her brother and sister; Cancer in her mother; Multiple myeloma in her sister.  ROS:   Please see the history of present illness.    All other systems reviewed and are negative.  EKGs/Labs/Other Studies Reviewed:    The following studies were reviewed today: EKG reveals atrial fibrillation with well-controlled ventricular rate.   Recent Labs: 01/16/2021: TSH 1.640 05/04/2021: ALT 11; Hemoglobin 13.1; Platelets 228 06/02/2021: BUN 14; Creatinine, Ser 1.21; Potassium 4.2; Sodium 142  Recent Lipid Panel  Component Value Date/Time   CHOL 162 05/04/2021 0958   TRIG 99 05/04/2021 0958   HDL 52 05/04/2021 0958   CHOLHDL 3.1 05/04/2021 0958   LDLCALC 92 05/04/2021 0958    Physical Exam:    VS:  BP (!) 154/74   Pulse 81   Ht '5\' 5"'  (1.651 m)   Wt 117 lb 12.8 oz (53.4 kg)   SpO2 98%   BMI 19.60 kg/m     Wt Readings from Last 3 Encounters:  09/30/21 117 lb 12.8 oz (53.4 kg)  09/10/21 117 lb (53.1 kg)  09/08/21 117 lb 6.4 oz (53.3 kg)     GEN: Patient is in no acute distress HEENT: Normal NECK: No JVD; No carotid bruits LYMPHATICS: No lymphadenopathy CARDIAC: Hear sounds regular, 2/6 systolic murmur at the apex. RESPIRATORY:  Clear to auscultation without rales, wheezing or rhonchi  ABDOMEN: Soft, non-tender, non-distended MUSCULOSKELETAL:  No edema; No deformity  SKIN: Warm and dry NEUROLOGIC:  Alert and oriented x 3 PSYCHIATRIC:  Normal affect   Signed, Jenean Lindau, MD  09/30/2021 12:59 PM    [facility]

## 2021-09-30 NOTE — Patient Instructions (Signed)
Medication Instructions:  Your physician recommends that you continue on your current medications as directed. Please refer to the Current Medication list given to you today.  *If you need a refill on your cardiac medications before your next appointment, please call your pharmacy*   Lab Work: None ordered If you have labs (blood work) drawn today and your tests are completely normal, you will receive your results only by: [place] (if you have MyChart) OR A paper copy in the mail If you have any lab test that is abnormal or we need to change your treatment, we will call you to review the results.   Testing/Procedures: None ordered   Follow-Up: At [facility], you and your health needs are our priority.  As part of our continuing mission to provide you with exceptional heart care, we have created designated Provider Care Teams.  These Care Teams include your primary Cardiologist (physician) and Advanced Practice Providers (APPs -  Physician Assistants and Nurse Practitioners) who all work together to provide you with the care you need, when you need it.  We recommend signing up for the patient portal called "MyChart".  Sign up information is provided on this After Visit Summary.  MyChart is used to connect with patients for Virtual Visits (Telemedicine).  Patients are able to view lab/test results, encounter notes, upcoming appointments, etc.  Non-urgent messages can be sent to your provider as well.   To learn more about what you can do with MyChart, go to [email or web address].    Your next appointment:   3 month(s)  The format for your next appointment:   In Person  Provider:   Jyl Heinz, MD   Other Instructions NA

## 2021-12-17 ENCOUNTER — Other Ambulatory Visit: Payer: Self-pay

## 2021-12-17 DIAGNOSIS — I4819 Other persistent atrial fibrillation: Secondary | ICD-10-CM

## 2021-12-17 MED ORDER — AMIODARONE HCL 100 MG PO TABS: 100.0000 mg | ORAL_TABLET | Freq: Every day | ORAL | 2 refills | Status: DC

## 2021-12-21 ENCOUNTER — Other Ambulatory Visit: Payer: Self-pay

## 2021-12-21 MED ORDER — AMIODARONE HCL 200 MG PO TABS: 100.0000 mg | ORAL_TABLET | Freq: Every day | ORAL | 2 refills | Status: DC

## 2021-12-21 NOTE — Telephone Encounter (Signed)
Received fax from [facility] stating that Pacerone 100 mg is not covered by patient's insurance and we need to send in Amiodarone 200 mg tablets which are covered by her insurance plan.

## 2022-01-01 ENCOUNTER — Ambulatory Visit: Payer: Medicare Other | Attending: Cardiology | Admitting: Cardiology

## 2022-01-06 ENCOUNTER — Encounter: Payer: Self-pay | Admitting: Cardiology

## 2022-05-28 ENCOUNTER — Telehealth: Payer: Self-pay | Admitting: Cardiology

## 2022-05-28 ENCOUNTER — Other Ambulatory Visit: Payer: Self-pay | Admitting: Cardiology

## 2022-05-28 ENCOUNTER — Other Ambulatory Visit: Payer: Self-pay

## 2022-05-28 MED ORDER — APIXABAN 2.5 MG PO TABS: 2.5000 mg | ORAL_TABLET | Freq: Two times a day (BID) | ORAL | 3 refills | Status: DC

## 2022-05-28 MED ORDER — METOPROLOL SUCCINATE ER 25 MG PO TB24: 25.0000 mg | ORAL_TABLET | Freq: Every day | ORAL | 3 refills | Status: DC

## 2022-05-28 NOTE — Telephone Encounter (Signed)
Informed Ronnald Nian, the patient's daughter, that the patient's prescriptions had been refilled. Ronnald Nian had no further questions at this time.

## 2022-05-28 NOTE — Telephone Encounter (Signed)
*  STAT* If patient is at the pharmacy, call can be transferred to refill team.   1. Which medications need to be refilled? (please list name of each medication and dose if known)  metoprolol succinate (TOPROL-XL) 25 MG 24 hr tablet  apixaban (ELIQUIS) 2.5 MG TABS tablet   2. Which pharmacy/location (including street and city if local pharmacy) is medication to be sent to? [id number] - [place], [place] - [address] AT [address]   3. Do they need a 30 day or 90 day supply? 90 day

## 2022-07-10 DIAGNOSIS — I340 Nonrheumatic mitral (valve) insufficiency: Secondary | ICD-10-CM

## 2022-07-10 DIAGNOSIS — I351 Nonrheumatic aortic (valve) insufficiency: Secondary | ICD-10-CM

## 2022-07-10 DIAGNOSIS — I361 Nonrheumatic tricuspid (valve) insufficiency: Secondary | ICD-10-CM

## 2022-08-09 NOTE — Progress Notes (Signed)
Cardiology Office Note:    Date:  08/10/2022   ID:  Latasha Barber, DOB [birth year], MRN [id number]  PCP:  Latasha Barber., MD   [facility] Providers Cardiologist:  None     Referring MD: [facility]*   CC: follow up hospitalization   History of Present Illness:    Latasha Barber is a 89 y.o. female with a hx of permanent atrial fibrillation, hypertension, moderate aortic regurgitation, hypothyroidism, stage IIIa CKD, dyslipidemia, stroke.  Most recently she was evaluated by Dr. Tomie Barber on 09/30/2021, at this time she was doing well from a cardiac perspective.  She was admitted to [facility] on 07/09/2022 to 07/14/2022 with acute hypoxemic respiratory failure.  She had been recently discharged on April 27 after admission for altered mental status secondary to pneumonia.  She was placed on BiPAP, proBNP 6550, viral panel was negative, EKG showed A-fib RVR, CTA showed large right pleural effusion and moderate left pleural effusion with atelectasis and consolidation.  Echocardiogram on 07/10/2022  revealed an EF of 55 to 60%, LA severely dilated, RA mildly dilated to moderate MR, mild aortic regurgitation, mild to moderate TR. she was diuresed and eventually discharged to a nursing facility for rehab.  She presents today accompanied by her son for follow-up after hospitalization as outlined above.  She was initially discharged to rehab, has been back at her own home for approximately 2 weeks.  She lives by herself however her daughter lives right in front of her, all of her children check on her very frequently.  She tells me that her family would like for her to move in with them however she is adamant that she wants to remain living in her own home.  We discussed that there is a give-and-take with that, she may be better suited to live with her family however they are agreeable to support her wishes.  She has not had any falls.  They are preparing meals for her and  check on her throughout the day.  Home health PT OT services are still working with her.  She does not have any formal complaints today. She denies chest pain, palpitations, dyspnea, pnd, orthopnea, n, v, dizziness, syncope, edema, weight gain, or early satiety.   Past Medical History:  Diagnosis Date   Abnormal PFT 01/24/2018   Acquired hypothyroidism 04/14/2015   Last Assessment & Plan:  Update her TSH for her today and refill her med for her through the mail order   AF (paroxysmal atrial fibrillation) (HCC) 10/28/2014   Anemia 04/14/2015   Last Assessment & Plan:  Update her CBC and ensure that this is stable for her   Anticoagulant long-term use 11/19/2016   Atrial fibrillation (HCC)    Chronic kidney disease, stage III (moderate) (HCC) 01/24/2018   Closed fracture of left distal radius 12/05/2020   Dizziness of unknown etiology 08/18/2021   DOE (dyspnea on exertion) 09/08/2021   Dyslipidemia 10/28/2014   Elevated brain natriuretic peptide (BNP) level 12/05/2020   Essential hypertension 10/28/2014   Last Assessment & Plan:  Formatting of this note might be different from the original. This was improved for her on the repeat and she is doing well   Hyperglycemia 04/14/2015   Last Assessment & Plan:  She is doing well with her diet and she is going to have labs drawn for her sugar overall   Hyperlipemia    Hypertension    Leg skin lesion, right 05/20/2020   Leiomyoma of uterus 04/14/2015  Long-term use of high-risk medication 04/14/2015   Mild major depression (HCC) 09/07/2021   Mixed hyperlipidemia 04/14/2015   Last Assessment & Plan:  Update the lipids for her today and she is going to have this done   Moderate aortic regurgitation 12/05/2020   Osteopenia 03/22/2016   2012: -1.2   Persistent atrial fibrillation (HCC) 12/05/2020   Right carotid bruit 12/05/2020   Shortness of breath 08/18/2021   Stage 3a chronic kidney disease (HCC) 01/24/2018   Weakness 08/18/2021    Past Surgical  History:  Procedure Laterality Date   APPENDECTOMY     BREAST LUMPECTOMY     HAND SURGERY     KNEE SURGERY     TONSILLECTOMY      Current Medications: Current Meds  Medication Sig   amiodarone (PACERONE) 200 MG tablet Take 0.5 tablets (100 mg total) by mouth daily.   apixaban (ELIQUIS) 2.5 MG TABS tablet Take 1 tablet (2.5 mg total) by mouth 2 (two) times daily.   b complex vitamins capsule Take 1 capsule by mouth daily. Unknown strength   Calcium Carbonate-Vitamin D 600-400 MG-UNIT per tablet Take 1 tablet by mouth 2 (two) times daily.   levothyroxine (SYNTHROID) 100 MCG tablet Take 100 mcg by mouth every morning.   lisinopril (ZESTRIL) 10 MG tablet Take 10 mg by mouth daily.   lovastatin (MEVACOR) 40 MG tablet Take 40 mg by mouth at bedtime.   metoprolol succinate (TOPROL-XL) 25 MG 24 hr tablet Take 1 tablet (25 mg total) by mouth daily.     Allergies:   Patient has no known allergies.   Social History   Socioeconomic History   Marital status: Widowed    Spouse name: Not on file   Number of children: Not on file   Years of education: Not on file   Highest education level: Not on file  Occupational History   Occupation: Part Time [employer]    Comment: [employer]  Tobacco Use   Smoking status: Never   Smokeless tobacco: Never  Substance and Sexual Activity   Alcohol use: No    Alcohol/week: 0.0 standard drinks of alcohol   Drug use: No   Sexual activity: Not on file  Other Topics Concern   Not on file  Social History Narrative   Not on file   Social Determinants of Health   Financial Resource Strain: Not on file  Food Insecurity: Not on file  Transportation Needs: Not on file  Physical Activity: Not on file  Stress: Not on file  Social Connections: Not on file     Family History: The patient's family history includes Alzheimer's disease in her sister; Breast cancer in her daughter; COPD in her brother and sister; Cancer in her mother; Multiple  myeloma in her sister.  ROS:   Please see the history of present illness.     All other systems reviewed and are negative.  EKGs/Labs/Other Studies Reviewed:    The following studies were reviewed today: Cardiac Studies & Procedures     STRESS TESTS  MYOCARDIAL PERFUSION IMAGING 09/10/2021  Narrative   The study is normal. The study is low risk.   Left ventricular function is normal. Nuclear stress EF: 61 %. The left ventricular ejection fraction is normal (55-65%). End diastolic cavity size is normal.   ECHOCARDIOGRAM  ECHOCARDIOGRAM COMPLETE 05/08/2021  Narrative ECHOCARDIOGRAM REPORT    Patient Name:   MATTOX HARPS Date of Exam: 05/08/2021 Medical Rec #:  [id number]  Height:       63.0 in Accession #:    [id number]      Weight:       120.6 lb Date of Birth:  03-06-1928       BSA:          1.560 m Patient Age:    89 years        BP:           174/82 mmHg Patient Gender: F               HR:           91 bpm. Exam Location:  [facility]  Procedure: 2D Echo, Cardiac Doppler, Color Doppler and Strain Analysis  Indications:    Moderate aortic regurgitation [I35.1 (ICD-10-CM)]  History:        Patient has prior history of Echocardiogram examinations, most recent 11/30/2020.  Sonographer:    Margreta Journey RDCS Referring Phys: Rito Ehrlich [facility]  IMPRESSIONS   1. Atrial fibrillation. Left ventricular ejection fraction, by estimation, is 55 to 60%. The left ventricle has normal function. The left ventricle has no regional wall motion abnormalities. There is mild concentric left ventricular hypertrophy. Left ventricular diastolic parameters are indeterminate. 2. Right ventricular systolic function is normal. The right ventricular size is normal. There is moderately elevated pulmonary artery systolic pressure. 3. Left atrial size was severely dilated. 4. The mitral valve is degenerative. Moderate mitral valve regurgitation. Mild mitral stenosis. 5. Tricuspid valve  regurgitation is moderate. 6. The aortic valve is tricuspid. Aortic valve regurgitation is mild to moderate. Aortic valve sclerosis is present, with no evidence of aortic valve stenosis. 7. The inferior vena cava is normal in size with greater than 50% respiratory variability, suggesting right atrial pressure of 3 mmHg.  FINDINGS Left Ventricle: Atrial fibrillation. Left ventricular ejection fraction, by estimation, is 55 to 60%. The left ventricle has normal function. The left ventricle has no regional wall motion abnormalities. The left ventricular internal cavity size was normal in size. There is mild concentric left ventricular hypertrophy. Left ventricular diastolic parameters are indeterminate.  Right Ventricle: The right ventricular size is normal. No increase in right ventricular wall thickness. Right ventricular systolic function is normal. There is moderately elevated pulmonary artery systolic pressure. The tricuspid regurgitant velocity is 3.21 m/s, and with an assumed right atrial pressure of 8 mmHg, the estimated right ventricular systolic pressure is 49.2 mmHg.  Left Atrium: Left atrial size was severely dilated.  Right Atrium: Right atrial size was normal in size.  Pericardium: There is no evidence of pericardial effusion.  Mitral Valve: The mitral valve is degenerative in appearance. There is mild thickening of the anterior and posterior mitral valve leaflet(s). There is mild calcification of the anterior and posterior mitral valve leaflet(s). Mild to moderate mitral annular calcification. Moderate mitral valve regurgitation. Mild mitral valve stenosis.  Tricuspid Valve: The tricuspid valve is normal in structure. Tricuspid valve regurgitation is moderate . No evidence of tricuspid stenosis.  Aortic Valve: The aortic valve is tricuspid. Aortic valve regurgitation is mild to moderate. Aortic regurgitation PHT measures 343 msec. Aortic valve sclerosis is present, with no evidence  of aortic valve stenosis.  Pulmonic Valve: The pulmonic valve was normal in structure. Pulmonic valve regurgitation is mild. No evidence of pulmonic stenosis.  Aorta: The aortic root and ascending aorta are structurally normal, with no evidence of dilitation and the aortic arch was not well visualized.  Venous: The pulmonary veins were not well  visualized. The inferior vena cava is normal in size with greater than 50% respiratory variability, suggesting right atrial pressure of 3 mmHg.  IAS/Shunts: No atrial level shunt detected by color flow Doppler.   LEFT VENTRICLE PLAX 2D LVIDd:         4.80 cm Diastology LVIDs:         3.50 cm LV e' medial:    6.31 cm/s LV PW:         1.00 cm LV E/e' medial:  23.9 LV IVS:        1.10 cm LV e' lateral:   7.07 cm/s LV E/e' lateral: 21.4   RIGHT VENTRICLE RV Basal diam:  2.80 cm RV S prime:     13.20 cm/s TAPSE (M-mode): 1.6 cm  LEFT ATRIUM              Index        RIGHT ATRIUM           Index LA diam:        4.70 cm  3.01 cm/m   RA Area:     12.70 cm LA Vol (A2C):   80.4 ml  51.55 ml/m  RA Volume:   23.10 ml  14.81 ml/m LA Vol (A4C):   113.0 ml 72.45 ml/m LA Biplane Vol: 102.0 ml 65.40 ml/m AORTIC VALVE             PULMONIC VALVE LVOT Vmax:   76.00 cm/s  PR End Diast Vel: 4.75 msec LVOT Vmean:  53.500 cm/s LVOT VTI:    0.135 m AI PHT:      343 msec  AORTA Ao Root diam: 3.20 cm Ao Asc diam:  3.00 cm  MITRAL VALVE                TRICUSPID VALVE MV Area (PHT): 4.89 cm     TR Peak grad:   41.2 mmHg MV Decel Time: 155 msec     TR Vmax:        321.00 cm/s MR Peak grad: 93.7 mmHg MR Mean grad: 68.0 mmHg     SHUNTS MR Vmax:      484.00 cm/s   Systemic VTI: 0.14 m MR Vmean:     395.0 cm/s MV E velocity: 151.00 cm/s  Norman Herrlich MD Electronically signed by Norman Herrlich MD Signature Date/Time: 05/08/2021/5:43:15 PM    Final              EKG:  EKG is  ordered today.  The ekg ordered today demonstrates atrial fibrillation,  heart rate 92 bpm, consistent with prior EKG tracings.  Recent Labs: No results found for requested labs within last 365 days.  Recent Lipid Panel    Component Value Date/Time   CHOL 162 05/04/2021 0958   TRIG 99 05/04/2021 0958   HDL 52 05/04/2021 0958   CHOLHDL 3.1 05/04/2021 0958   LDLCALC 92 05/04/2021 0958     Risk Assessment/Calculations:    CHA2DS2-VASc Score = 7   This indicates a 11.2% annual risk of stroke. The patient's score is based upon: CHF History: 1 HTN History: 1 Diabetes History: 0 Stroke History: 2 Vascular Disease History: 0 Age Score: 2 Gender Score: 1           Physical Exam:    VS:  BP (!) 142/78 (BP Location: Right Arm, Patient Position: Sitting)   Pulse 92   Ht 5\' 5"  (1.651 m)   Wt 112 lb 12.8 oz (51.2 kg)  SpO2 98%   BMI 18.77 kg/m     Wt Readings from Last 3 Encounters:  08/10/22 112 lb 12.8 oz (51.2 kg)  09/30/21 117 lb 12.8 oz (53.4 kg)  09/10/21 117 lb (53.1 kg)     GEN: Appears stated age HEENT: Normal NECK: No JVD; No carotid bruits LYMPHATICS: No lymphadenopathy CARDIAC: Irregularly irregular, no murmurs, rubs, gallops RESPIRATORY:  Clear to auscultation without rales, wheezing or rhonchi  ABDOMEN: Soft, non-tender, non-distended MUSCULOSKELETAL:  No edema; No deformity  SKIN: Warm and dry NEUROLOGIC:  Alert and oriented x 2, appears to have some cognitive decline PSYCHIATRIC:  Normal affect   ASSESSMENT:    1. Persistent atrial fibrillation (HCC)   2. Moderate aortic regurgitation   3. Mixed hyperlipidemia   4. Stage 3a chronic kidney disease (HCC)   5. Primary hypertension   6. Hypercoagulable state (HCC)   7. At high risk for falls    PLAN:    In order of problems listed above:  Atrial fibrillation/hypercoagulable state-CHA2DS2-VASc score of 7, rate controlled today at 92 bpm, no recent falls, denies hematochezia, hematuria, hemoptysis.  Continue Eliquis 2.5 mg twice daily--dose reduction per primary  cardiologist, weight 112 pounds, 89 years old, Cr 1.10, however she is very frail, most recent hemoglobin 13.1 and hematocrit 39.4.  Continue amiodarone 100 mg daily--most recent TSH was 4.04, normal LFTs.  Continue metoprolol 25 mg daily. Moderate aortic regurgitation- no indication for repeat imaging at this time, denies dizziness, syncope, chest pain. Hyperlipidemia-recent LDL was 92, would like this to be less than 70 due to history of stroke, however she is frail and I think a simplified medical management approach is best suited for her at this. Hypertension-blood pressure today is mildly elevated at 142/78 however she is a high fall risk and I worry if we make any adjustments to her current medication regimen this may increase her risk of fall, which would be catastrophic for her.  Continue lisinopril 10 mg daily, continue metoprolol 25 mg daily. CKD stage IIIa- Careful titration of diuretic and antihypertensive.   High risk for falls - she is frail, living alone however family is very close by and very involved. It sounds as though they feel she should stay with one of them, however she is very independent and she wants to remain in her home. She is very careful throughout her home, we discussed it being a trade off of increased risk of fall associated with frailty and living alone however they are honoring her wishes. A streamlined medical management should be continued.      Disposition- Return in 3 months.   Medication Adjustments/Labs and Tests Ordered: Current medicines are reviewed at length with the patient today.  Concerns regarding medicines are outlined above.  Orders Placed This Encounter  Procedures   EKG 12-Lead   No orders of the defined types were placed in this encounter.   Patient Instructions  Medication Instructions:  Your physician recommends that you continue on your current medications as directed. Please refer to the Current Medication list given to you  today.  *If you need a refill on your cardiac medications before your next appointment, please call your pharmacy*   Lab Work: None If you have labs (blood work) drawn today and your tests are completely normal, you will receive your results only by: MyChart Message (if you have MyChart) OR A paper copy in the mail If you have any lab test that is abnormal or we need to change  your treatment, we will call you to review the results.   Testing/Procedures: None   Follow-Up: At [facility], you and your health needs are our priority.  As part of our continuing mission to provide you with exceptional heart care, we have created designated Provider Care Teams.  These Care Teams include your primary Cardiologist (physician) and Advanced Practice Providers (APPs -  Physician Assistants and Nurse Practitioners) who all work together to provide you with the care you need, when you need it.  We recommend signing up for the patient portal called "MyChart".  Sign up information is provided on this After Visit Summary.  MyChart is used to connect with patients for Virtual Visits (Telemedicine).  Patients are able to view lab/test results, encounter notes, upcoming appointments, etc.  Non-urgent messages can be sent to your provider as well.   To learn more about what you can do with MyChart, go to [email or web address].    Your next appointment:   3 month(s)  Provider:   Belva Crome, MD    Other Instructions None    Signed, Flossie Dibble, NP  08/10/2022 12:37 PM    [facility]

## 2022-08-10 ENCOUNTER — Ambulatory Visit: Payer: Medicare Other | Attending: Cardiology | Admitting: Cardiology

## 2022-08-10 ENCOUNTER — Encounter: Payer: Self-pay | Admitting: Cardiology

## 2022-08-10 VITALS — BP 142/78 | HR 92 | Ht 65.0 in | Wt 112.8 lb

## 2022-08-10 DIAGNOSIS — I10 Essential (primary) hypertension: Secondary | ICD-10-CM

## 2022-08-10 DIAGNOSIS — N1831 Chronic kidney disease, stage 3a: Secondary | ICD-10-CM

## 2022-08-10 DIAGNOSIS — I351 Nonrheumatic aortic (valve) insufficiency: Secondary | ICD-10-CM

## 2022-08-10 DIAGNOSIS — D6859 Other primary thrombophilia: Secondary | ICD-10-CM

## 2022-08-10 DIAGNOSIS — E782 Mixed hyperlipidemia: Secondary | ICD-10-CM

## 2022-08-10 DIAGNOSIS — I4819 Other persistent atrial fibrillation: Secondary | ICD-10-CM | POA: Diagnosis not present

## 2022-08-10 DIAGNOSIS — Z9181 History of falling: Secondary | ICD-10-CM

## 2022-08-10 NOTE — Patient Instructions (Signed)
Medication Instructions:  Your physician recommends that you continue on your current medications as directed. Please refer to the Current Medication list given to you today.  *If you need a refill on your cardiac medications before your next appointment, please call your pharmacy*   Lab Work: None If you have labs (blood work) drawn today and your tests are completely normal, you will receive your results only by: MyChart Message (if you have MyChart) OR A paper copy in the mail If you have any lab test that is abnormal or we need to change your treatment, we will call you to review the results.   Testing/Procedures: None   Follow-Up: At [facility], you and your health needs are our priority.  As part of our continuing mission to provide you with exceptional heart care, we have created designated Provider Care Teams.  These Care Teams include your primary Cardiologist (physician) and Advanced Practice Providers (APPs -  Physician Assistants and Nurse Practitioners) who all work together to provide you with the care you need, when you need it.  We recommend signing up for the patient portal called "MyChart".  Sign up information is provided on this After Visit Summary.  MyChart is used to connect with patients for Virtual Visits (Telemedicine).  Patients are able to view lab/test results, encounter notes, upcoming appointments, etc.  Non-urgent messages can be sent to your provider as well.   To learn more about what you can do with MyChart, go to [email or web address].    Your next appointment:   3 month(s)  Provider:   Belva Crome, MD    Other Instructions None

## 2022-11-09 NOTE — Progress Notes (Unsigned)
Cardiology Office Note:    Date:  11/11/2022   ID:  Latasha Barber, DOB 07-Mar-1928, MRN [id number]  PCP:  Gordan Payment., MD   [facility] Providers Cardiologist:  None     Referring MD: Gordan Payment., MD   CC: follow up atrial fibrillation   History of Present Illness:    Latasha Barber is a 89 y.o. female with a hx of permanent atrial fibrillation, hypertension, moderate aortic regurgitation, hypothyroidism, stage IIIa CKD, dyslipidemia, stroke.  Most recently she was evaluated by Dr. Tomie China on 09/30/2021, at this time she was doing well from a cardiac perspective.  She was admitted to [facility] on 07/09/2022 to 07/14/2022 with acute hypoxemic respiratory failure.  She had been recently discharged on April 27 after admission for altered mental status secondary to pneumonia.  She was placed on BiPAP, proBNP 6550, viral panel was negative, EKG showed A-fib RVR, CTA showed large right pleural effusion and moderate left pleural effusion with atelectasis and consolidation.  Echocardiogram on 07/10/2022  revealed an EF of 55 to 60%, LA severely dilated, RA mildly dilated to moderate MR, mild aortic regurgitation, mild to moderate TR. she was diuresed and eventually discharged to a nursing facility for rehab.  She presents today accompanied by her son for follow-up of her atrial fibrillation.  She lives independently in her home, multiple family members live close by her.  She apparently had a fall a few nights ago, her family arrived to find her in her bed--she apparently had fallen during the night and struck her head but was able to get back in bed and had no recollection of the events.  She has also had 2 other falls within the last few months.  We spent some time discussing continuing Eliquis and the possibility of a catastrophic event should she have a fall and strike her head, we also discussed the probability of her having a recurrent stroke if she were to stop the  Eliquis.  For now, the family wants to continue on her anticoagulant. She denies chest pain, palpitations, dyspnea, pnd, orthopnea, n, v, dizziness, syncope, edema, weight gain, or early satiety.   Past Medical History:  Diagnosis Date   Abnormal PFT 01/24/2018   Acquired hypothyroidism 04/14/2015   Last Assessment & Plan:  Update her TSH for her today and refill her med for her through the mail order   AF (paroxysmal atrial fibrillation) (HCC) 10/28/2014   Anemia 04/14/2015   Last Assessment & Plan:  Update her CBC and ensure that this is stable for her   Anticoagulant long-term use 11/19/2016   Atrial fibrillation (HCC)    Chronic kidney disease, stage III (moderate) (HCC) 01/24/2018   Closed fracture of left distal radius 12/05/2020   Dizziness of unknown etiology 08/18/2021   DOE (dyspnea on exertion) 09/08/2021   Dyslipidemia 10/28/2014   Elevated brain natriuretic peptide (BNP) level 12/05/2020   Essential hypertension 10/28/2014   Last Assessment & Plan:  Formatting of this note might be different from the original. This was improved for her on the repeat and she is doing well   Hyperglycemia 04/14/2015   Last Assessment & Plan:  She is doing well with her diet and she is going to have labs drawn for her sugar overall   Hyperlipemia    Hypertension    Leg skin lesion, right 05/20/2020   Leiomyoma of uterus 04/14/2015   Long-term use of high-risk medication 04/14/2015   Mild major depression (HCC) 09/07/2021  Mixed hyperlipidemia 04/14/2015   Last Assessment & Plan:  Update the lipids for her today and she is going to have this done   Moderate aortic regurgitation 12/05/2020   Osteopenia 03/22/2016   2012: -1.2   Persistent atrial fibrillation (HCC) 12/05/2020   Right carotid bruit 12/05/2020   Shortness of breath 08/18/2021   Stage 3a chronic kidney disease (HCC) 01/24/2018   Weakness 08/18/2021    Past Surgical History:  Procedure Laterality Date   APPENDECTOMY     BREAST  LUMPECTOMY     HAND SURGERY     KNEE SURGERY     TONSILLECTOMY      Current Medications: Current Meds  Medication Sig   amiodarone (PACERONE) 200 MG tablet Take 0.5 tablets (100 mg total) by mouth daily.   apixaban (ELIQUIS) 2.5 MG TABS tablet Take 1 tablet (2.5 mg total) by mouth 2 (two) times daily.   Calcium Carbonate-Vitamin D 600-400 MG-UNIT per tablet Take 1 tablet by mouth 2 (two) times daily.   levothyroxine (SYNTHROID) 100 MCG tablet Take 100 mcg by mouth every morning.   lisinopril (ZESTRIL) 10 MG tablet Take 10 mg by mouth daily.   lovastatin (MEVACOR) 40 MG tablet Take 40 mg by mouth at bedtime.   metoprolol succinate (TOPROL-XL) 25 MG 24 hr tablet Take 1 tablet (25 mg total) by mouth daily.     Allergies:   Patient has no known allergies.   Social History   Socioeconomic History   Marital status: Widowed    Spouse name: Not on file   Number of children: Not on file   Years of education: Not on file   Highest education level: Not on file  Occupational History   Occupation: Part Time [employer]    Comment: [employer]  Tobacco Use   Smoking status: Never   Smokeless tobacco: Never  Substance and Sexual Activity   Alcohol use: No    Alcohol/week: 0.0 standard drinks of alcohol   Drug use: No   Sexual activity: Not on file  Other Topics Concern   Not on file  Social History Narrative   Not on file   Social Determinants of Health   Financial Resource Strain: Not on file  Food Insecurity: Low Risk  (09/02/2022)   Received from [facility]   Hunger Vital Sign    Worried About Running Out of Food in the Last Year: Never true    Ran Out of Food in the Last Year: Never true  Transportation Needs: No Transportation Needs (09/02/2022)   Received from [facility]    In the past 12 months, has lack of reliable transportation kept you from medical appointments, meetings, work or from getting things needed for daily living? : No  Physical  Activity: Not on file  Stress: Not on file  Social Connections: Not on file     Family History: The patient's family history includes Alzheimer's disease in her sister; Breast cancer in her daughter; COPD in her brother and sister; Cancer in her mother; Multiple myeloma in her sister.  ROS:   Please see the history of present illness.     All other systems reviewed and are negative.  EKGs/Labs/Other Studies Reviewed:    The following studies were reviewed today: Cardiac Studies & Procedures     STRESS TESTS  MYOCARDIAL PERFUSION IMAGING 09/10/2021  Narrative   The study is normal. The study is low risk.   Left ventricular function is normal. Nuclear stress  EF: 61 %. The left ventricular ejection fraction is normal (55-65%). End diastolic cavity size is normal.   ECHOCARDIOGRAM  ECHOCARDIOGRAM COMPLETE 05/08/2021  Narrative ECHOCARDIOGRAM REPORT    Patient Name:   Latasha Barber Date of Exam: 05/08/2021 Medical Rec #:  [id number]       Height:       63.0 in Accession #:    [id number]      Weight:       120.6 lb Date of Birth:  1928/09/30       BSA:          1.560 m Patient Age:    89 years        BP:           174/82 mmHg Patient Gender: F               HR:           91 bpm. Exam Location:  [place]  Procedure: 2D Echo, Cardiac Doppler, Color Doppler and Strain Analysis  Indications:    Moderate aortic regurgitation [I35.1 (ICD-10-CM)]  History:        Patient has prior history of Echocardiogram examinations, most recent 11/30/2020.  Sonographer:    Margreta Journey RDCS Referring Phys: Rito Ehrlich [facility]  IMPRESSIONS   1. Atrial fibrillation. Left ventricular ejection fraction, by estimation, is 55 to 60%. The left ventricle has normal function. The left ventricle has no regional wall motion abnormalities. There is mild concentric left ventricular hypertrophy. Left ventricular diastolic parameters are indeterminate. 2. Right ventricular systolic function is  normal. The right ventricular size is normal. There is moderately elevated pulmonary artery systolic pressure. 3. Left atrial size was severely dilated. 4. The mitral valve is degenerative. Moderate mitral valve regurgitation. Mild mitral stenosis. 5. Tricuspid valve regurgitation is moderate. 6. The aortic valve is tricuspid. Aortic valve regurgitation is mild to moderate. Aortic valve sclerosis is present, with no evidence of aortic valve stenosis. 7. The inferior vena cava is normal in size with greater than 50% respiratory variability, suggesting right atrial pressure of 3 mmHg.  FINDINGS Left Ventricle: Atrial fibrillation. Left ventricular ejection fraction, by estimation, is 55 to 60%. The left ventricle has normal function. The left ventricle has no regional wall motion abnormalities. The left ventricular internal cavity size was normal in size. There is mild concentric left ventricular hypertrophy. Left ventricular diastolic parameters are indeterminate.  Right Ventricle: The right ventricular size is normal. No increase in right ventricular wall thickness. Right ventricular systolic function is normal. There is moderately elevated pulmonary artery systolic pressure. The tricuspid regurgitant velocity is 3.21 m/s, and with an assumed right atrial pressure of 8 mmHg, the estimated right ventricular systolic pressure is 49.2 mmHg.  Left [facility]: Left atrial size was severely dilated.  Right [facility]: Right atrial size was normal in size.  Pericardium: There is no evidence of pericardial effusion.  Mitral Valve: The mitral valve is degenerative in appearance. There is mild thickening of the anterior and posterior mitral valve leaflet(s). There is mild calcification of the anterior and posterior mitral valve leaflet(s). Mild to moderate mitral annular calcification. Moderate mitral valve regurgitation. Mild mitral valve stenosis.  Tricuspid Valve: The tricuspid valve is normal in structure.  Tricuspid valve regurgitation is moderate . No evidence of tricuspid stenosis.  Aortic Valve: The aortic valve is tricuspid. Aortic valve regurgitation is mild to moderate. Aortic regurgitation PHT measures 343 msec. Aortic valve sclerosis is present, with no evidence of aortic valve stenosis.  Pulmonic Valve: The pulmonic valve was normal in structure. Pulmonic valve regurgitation is mild. No evidence of pulmonic stenosis.  Aorta: The aortic root and ascending aorta are structurally normal, with no evidence of dilitation and the aortic arch was not well visualized.  Venous: The pulmonary veins were not well visualized. The inferior vena cava is normal in size with greater than 50% respiratory variability, suggesting right atrial pressure of 3 mmHg.  IAS/Shunts: No atrial level shunt detected by color flow Doppler.   LEFT VENTRICLE PLAX 2D LVIDd:         4.80 cm Diastology LVIDs:         3.50 cm LV e' medial:    6.31 cm/s LV PW:         1.00 cm LV E/e' medial:  23.9 LV IVS:        1.10 cm LV e' lateral:   7.07 cm/s LV E/e' lateral: 21.4   RIGHT VENTRICLE RV Basal diam:  2.80 cm RV S prime:     13.20 cm/s TAPSE (M-mode): 1.6 cm  LEFT ATRIUM              Index        RIGHT ATRIUM           Index LA diam:        4.70 cm  3.01 cm/m   RA Area:     12.70 cm LA Vol (A2C):   80.4 ml  51.55 ml/m  RA Volume:   23.10 ml  14.81 ml/m LA Vol (A4C):   113.0 ml 72.45 ml/m LA Biplane Vol: 102.0 ml 65.40 ml/m AORTIC VALVE             PULMONIC VALVE LVOT Vmax:   76.00 cm/s  PR End Diast Vel: 4.75 msec LVOT Vmean:  53.500 cm/s LVOT VTI:    0.135 m AI PHT:      343 msec  AORTA Ao Root diam: 3.20 cm Ao Asc diam:  3.00 cm  MITRAL VALVE                TRICUSPID VALVE MV Area (PHT): 4.89 cm     TR Peak grad:   41.2 mmHg MV Decel Time: 155 msec     TR Vmax:        321.00 cm/s MR Peak grad: 93.7 mmHg MR Mean grad: 68.0 mmHg     SHUNTS MR Vmax:      484.00 cm/s   Systemic VTI: 0.14 m MR  Vmean:     395.0 cm/s MV E velocity: 151.00 cm/s  Norman Herrlich MD Electronically signed by Norman Herrlich MD Signature Date/Time: 05/08/2021/5:43:15 PM    Final              EKG Interpretation Date/Time:  Thursday November 11 2022 09:17:55 EDT Ventricular Rate:  90 PR Interval:    QRS Duration:  104 QT Interval:  342 QTC Calculation: 418 R Axis:   128  Text Interpretation: Atrial fibrillation with premature ventricular or aberrantly conducted complexes Right axis deviation Nonspecific ST and T wave abnormality Abnormal ECG No previous ECGs available Confirmed by Wallis Bamberg [phone]) on 11/11/2022 10:01:53 AM    Recent Labs: No results found for requested labs within last 365 days.  Recent Lipid Panel    Component Value Date/Time   CHOL 162 05/04/2021 0958   TRIG 99 05/04/2021 0958   HDL 52 05/04/2021 0958   CHOLHDL 3.1 05/04/2021 0958   LDLCALC 92 05/04/2021 0958  Risk Assessment/Calculations:    CHA2DS2-VASc Score = 7   This indicates a 11.2% annual risk of stroke. The patient's score is based upon: CHF History: 1 HTN History: 1 Diabetes History: 0 Stroke History: 2 Vascular Disease History: 0 Age Score: 2 Gender Score: 1           Physical Exam:    VS:  BP 128/80 (BP Location: Right Arm, Patient Position: Sitting, Cuff Size: Normal)   Pulse 90   Ht 5\' 5"  (1.651 m)   Wt 116 lb 3.2 oz (52.7 kg)   SpO2 98%   BMI 19.34 kg/m     Wt Readings from Last 3 Encounters:  11/11/22 116 lb 3.2 oz (52.7 kg)  08/10/22 112 lb 12.8 oz (51.2 kg)  09/30/21 117 lb 12.8 oz (53.4 kg)     GEN: Appears stated age HEENT: Normal NECK: No JVD; No carotid bruits LYMPHATICS: No lymphadenopathy CARDIAC: Irregularly irregular, no murmurs, rubs, gallops RESPIRATORY:  Clear to auscultation without rales, wheezing or rhonchi  ABDOMEN: Soft, non-tender, non-distended MUSCULOSKELETAL:  No edema; No deformity  SKIN: Warm and dry NEUROLOGIC:  Alert and oriented x 2,  appears to have some cognitive decline PSYCHIATRIC:  Normal affect   ASSESSMENT:    1. Persistent atrial fibrillation (HCC)   2. Primary hypertension   3. Hypercoagulable state (HCC)   4. Moderate aortic regurgitation   5. Stage 3a chronic kidney disease (HCC)   6. At high risk for falls   7. On amiodarone therapy     PLAN:    In order of problems listed above:  Atrial fibrillation/hypercoagulable state/on amiodarone therapy-CHA2DS2-VASc score of 7, rate controlled today at 90 bpm, no recent falls, denies hematochezia, hematuria, hemoptysis.  Continue Eliquis 2.5 mg twice daily--dose reduction per primary cardiologist, weight 112 pounds, 89 years old, Cr 1.10, however she is very frail, most recent hemoglobin 13.1 and hematocrit 39.4.  Continue amiodarone 100 mg daily.  Will repeat thyroid panel, LFTs, CBC.  Continue metoprolol. Moderate aortic regurgitation- no indication for repeat imaging at this time, denies dizziness, syncope, chest pain. Hyperlipidemia-recent LDL was 92, would like this to be less than 70 due to history of stroke, however she is frail and I think a simplified medical management approach is best suited for her at this time. Hypertension-blood pressure today is controlled at 128/80. Continue lisinopril 10 mg daily, continue metoprolol 25 mg daily. CKD stage IIIa- Careful titration of diuretic and antihypertensive.   High risk for falls - she is frail, living alone however family is very close by and very involved. It sounds as though they feel she should stay with one of them, however she is very independent and she wants to remain in her home. She is very careful throughout her home, we discussed it being a trade off of increased risk of fall associated with frailty and living alone however they are honoring her wishes.  She has had 3 falls in the last 3 months, we again discussed the trade off that is associated with living independently and the risk that can come with  that regarding falls.     Disposition-CBC, BMET, thyroid panel.  Return in 3 months with primary cardiologist.  Medication Adjustments/Labs and Tests Ordered: Current medicines are reviewed at length with the patient today.  Concerns regarding medicines are outlined above.  Orders Placed This Encounter  Procedures   CBC with Differential/Platelet   Comprehensive metabolic panel   T3, free   T4, free  TSH   EKG 12-Lead   No orders of the defined types were placed in this encounter.   Patient Instructions  Medication Instructions:  Your physician recommends that you continue on your current medications as directed. Please refer to the Current Medication list given to you today.  *If you need a refill on your cardiac medications before your next appointment, please call your pharmacy*   Lab Work: Your physician recommends that you return for lab work in: today for a CBC, CMP, TSH, T3 & T4  If you have labs (blood work) drawn today and your tests are completely normal, you will receive your results only by: MyChart Message (if you have MyChart) OR A paper copy in the mail If you have any lab test that is abnormal or we need to change your treatment, we will call you to review the results.   Testing/Procedures: NONE   Follow-Up: At [facility], you and your health needs are our priority.  As part of our continuing mission to provide you with exceptional heart care, we have created designated Provider Care Teams.  These Care Teams include your primary Cardiologist (physician) and Advanced Practice Providers (APPs -  Physician Assistants and Nurse Practitioners) who all work together to provide you with the care you need, when you need it.  We recommend signing up for the patient portal called "MyChart".  Sign up information is provided on this After Visit Summary.  MyChart is used to connect with patients for Virtual Visits (Telemedicine).  Patients are able to view  lab/test results, encounter notes, upcoming appointments, etc.  Non-urgent messages can be sent to your provider as well.   To learn more about what you can do with MyChart, go to [email or web address].    Your next appointment:   3 month(s)  Provider:   Belva Crome, MD    Other Instructions     Signed, Flossie Dibble, NP  11/11/2022 10:01 AM    [facility]

## 2022-11-11 ENCOUNTER — Ambulatory Visit: Payer: Medicare Other | Attending: Cardiology | Admitting: Cardiology

## 2022-11-11 ENCOUNTER — Encounter: Payer: Self-pay | Admitting: Cardiology

## 2022-11-11 VITALS — BP 128/80 | HR 90 | Ht 65.0 in | Wt 116.2 lb

## 2022-11-11 DIAGNOSIS — Z9181 History of falling: Secondary | ICD-10-CM

## 2022-11-11 DIAGNOSIS — D6859 Other primary thrombophilia: Secondary | ICD-10-CM

## 2022-11-11 DIAGNOSIS — N1831 Chronic kidney disease, stage 3a: Secondary | ICD-10-CM

## 2022-11-11 DIAGNOSIS — I10 Essential (primary) hypertension: Secondary | ICD-10-CM

## 2022-11-11 DIAGNOSIS — I351 Nonrheumatic aortic (valve) insufficiency: Secondary | ICD-10-CM

## 2022-11-11 DIAGNOSIS — I4819 Other persistent atrial fibrillation: Secondary | ICD-10-CM | POA: Diagnosis not present

## 2022-11-11 DIAGNOSIS — Z79899 Other long term (current) drug therapy: Secondary | ICD-10-CM

## 2022-11-11 NOTE — Patient Instructions (Signed)
Medication Instructions:  Your physician recommends that you continue on your current medications as directed. Please refer to the Current Medication list given to you today.  *If you need a refill on your cardiac medications before your next appointment, please call your pharmacy*   Lab Work: Your physician recommends that you return for lab work in: today for a CBC, CMP, TSH, T3 & T4  If you have labs (blood work) drawn today and your tests are completely normal, you will receive your results only by: MyChart Message (if you have MyChart) OR A paper copy in the mail If you have any lab test that is abnormal or we need to change your treatment, we will call you to review the results.   Testing/Procedures: NONE   Follow-Up: At [facility], you and your health needs are our priority.  As part of our continuing mission to provide you with exceptional heart care, we have created designated Provider Care Teams.  These Care Teams include your primary Cardiologist (physician) and Advanced Practice Providers (APPs -  Physician Assistants and Nurse Practitioners) who all work together to provide you with the care you need, when you need it.  We recommend signing up for the patient portal called "MyChart".  Sign up information is provided on this After Visit Summary.  MyChart is used to connect with patients for Virtual Visits (Telemedicine).  Patients are able to view lab/test results, encounter notes, upcoming appointments, etc.  Non-urgent messages can be sent to your provider as well.   To learn more about what you can do with MyChart, go to [email or web address].    Your next appointment:   3 month(s)  Provider:   Belva Crome, MD    Other Instructions

## 2022-11-12 LAB — COMPREHENSIVE METABOLIC PANEL WITH GFR
ALT: 18 IU/L (ref 0–32)
AST: 26 IU/L (ref 0–40)
Albumin: 3.9 g/dL (ref 3.6–4.6)
Alkaline Phosphatase: 120 IU/L (ref 44–121)
BUN/Creatinine Ratio: 15 (ref 12–28)
BUN: 17 mg/dL (ref 10–36)
Bilirubin Total: 0.6 mg/dL (ref 0.0–1.2)
CO2: 23 mmol/L (ref 20–29)
Calcium: 8.8 mg/dL (ref 8.7–10.3)
Chloride: 101 mmol/L (ref 96–106)
Creatinine, Ser: 1.1 mg/dL — ABNORMAL HIGH (ref 0.57–1.00)
Globulin, Total: 2.5 g/dL (ref 1.5–4.5)
Glucose: 143 mg/dL — ABNORMAL HIGH (ref 70–99)
Potassium: 3.4 mmol/L — ABNORMAL LOW (ref 3.5–5.2)
Sodium: 138 mmol/L (ref 134–144)
Total Protein: 6.4 g/dL (ref 6.0–8.5)
eGFR: 47 mL/min/1.73 — ABNORMAL LOW

## 2022-11-12 LAB — CBC WITH DIFFERENTIAL/PLATELET
Basophils Absolute: 0.1 x10E3/uL (ref 0.0–0.2)
Basos: 1 %
EOS (ABSOLUTE): 0.2 x10E3/uL (ref 0.0–0.4)
Eos: 2 %
Hematocrit: 34.8 % (ref 34.0–46.6)
Hemoglobin: 11.2 g/dL (ref 11.1–15.9)
Immature Grans (Abs): 0 x10E3/uL (ref 0.0–0.1)
Immature Granulocytes: 1 %
Lymphocytes Absolute: 1.3 x10E3/uL (ref 0.7–3.1)
Lymphs: 20 %
MCH: 28.2 pg (ref 26.6–33.0)
MCHC: 32.2 g/dL (ref 31.5–35.7)
MCV: 88 fL (ref 79–97)
Monocytes Absolute: 0.5 x10E3/uL (ref 0.1–0.9)
Monocytes: 7 %
Neutrophils Absolute: 4.4 x10E3/uL (ref 1.4–7.0)
Neutrophils: 69 %
Platelets: 262 x10E3/uL (ref 150–450)
RBC: 3.97 x10E6/uL (ref 3.77–5.28)
RDW: 15.8 % — ABNORMAL HIGH (ref 11.7–15.4)
WBC: 6.4 x10E3/uL (ref 3.4–10.8)

## 2022-11-12 LAB — T3, FREE: T3, Free: 0.6 pg/mL — ABNORMAL LOW (ref 2.0–4.4)

## 2022-11-12 LAB — T4, FREE: Free T4: 0.16 ng/dL — ABNORMAL LOW (ref 0.82–1.77)

## 2022-11-12 LAB — TSH: TSH: 86.1 u[IU]/mL — ABNORMAL HIGH (ref 0.450–4.500)

## 2022-11-16 ENCOUNTER — Encounter: Payer: Self-pay | Admitting: Cardiology

## 2022-11-16 ENCOUNTER — Ambulatory Visit: Payer: Medicare Other | Attending: Cardiology | Admitting: Cardiology

## 2022-11-16 VITALS — BP 130/76 | HR 75 | Ht 65.0 in | Wt 115.8 lb

## 2022-11-16 DIAGNOSIS — E782 Mixed hyperlipidemia: Secondary | ICD-10-CM | POA: Diagnosis not present

## 2022-11-16 DIAGNOSIS — I4819 Other persistent atrial fibrillation: Secondary | ICD-10-CM

## 2022-11-16 DIAGNOSIS — I10 Essential (primary) hypertension: Secondary | ICD-10-CM | POA: Diagnosis not present

## 2022-11-16 NOTE — Patient Instructions (Signed)
Medication Instructions:  Your physician has recommended you make the following change in your medication:   Stop Amiodarone  Stop Apixaban  *If you need a refill on your cardiac medications before your next appointment, please call your pharmacy*   Lab Work: None ordered If you have labs (blood work) drawn today and your tests are completely normal, you will receive your results only by: MyChart Message (if you have MyChart) OR A paper copy in the mail If you have any lab test that is abnormal or we need to change your treatment, we will call you to review the results.   Testing/Procedures: None ordered   Follow-Up: At [facility], you and your health needs are our priority.  As part of our continuing mission to provide you with exceptional heart care, we have created designated Provider Care Teams.  These Care Teams include your primary Cardiologist (physician) and Advanced Practice Providers (APPs -  Physician Assistants and Nurse Practitioners) who all work together to provide you with the care you need, when you need it.  We recommend signing up for the patient portal called "MyChart".  Sign up information is provided on this After Visit Summary.  MyChart is used to connect with patients for Virtual Visits (Telemedicine).  Patients are able to view lab/test results, encounter notes, upcoming appointments, etc.  Non-urgent messages can be sent to your provider as well.   To learn more about what you can do with MyChart, go to [email or web address].    Your next appointment:   3 month(s)  The format for your next appointment:   In Person  Provider:   Belva Crome, MD    Other Instructions none  Important Information About Sugar

## 2022-11-16 NOTE — Progress Notes (Signed)
Cardiology Office Note:    Date:  11/16/2022   ID:  Latasha Barber, DOB [birth year], MRN [id number]  PCP:  Gordan Payment., MD  Cardiologist:  Garwin Brothers, MD   Referring MD: Gordan Payment., MD    ASSESSMENT:    1. Primary hypertension   2. Persistent atrial fibrillation (HCC)   3. Essential hypertension   4. Mixed hyperlipidemia    PLAN:    In order of problems listed above:  Primary prevention stressed with the patient.  Importance of compliance with diet medication stressed and patient verbalized standing. Atrial fibrillation: Unstable gait and frequent falls and dementia: I had a very lengthy discussion with her son about the situation.  In this case the risks clearly outweigh the benefits of anticoagulation.  I discussed this at length.  Standard questions answered with the patient and questions were answered to their satisfaction.  He agreed along with his mother that we will stop the anticoagulation.  We could take a coated baby aspirin on a daily basis. Amiodarone therapy: Patient is in atrial fibrillation.  I do not see clear benefit of amiodarone therapy and we will discontinue this. Abnormal thyroid testing: This is followed by primary care. Patient will be seen in follow-up appointment in 6 months or earlier if the patient has any concerns.    Medication Adjustments/Labs and Tests Ordered: Current medicines are reviewed at length with the patient today.  Concerns regarding medicines are outlined above.  Orders Placed This Encounter  Procedures   EKG 12-Lead   No orders of the defined types were placed in this encounter.    No chief complaint on file.    History of Present Illness:    Latasha Barber is a 89 y.o. female.  Patient has past medical history of persistent atrial fibrillation, renal insufficiency, essential hypertension.  She has significant issues with dementia and gait instability.  Son is accompanying her for this visit.  They were given  for his appointment as she saw our nurse practitioner who informed me that the patient is having frequent falls, gait instability and significant dementia issues.  The son confirms this.  She is on anticoagulation for atrial fibrillation and also on amiodarone therapy.  Past Medical History:  Diagnosis Date   Abnormal PFT 01/24/2018   Acquired hypothyroidism 04/14/2015   Last Assessment & Plan:  Update her TSH for her today and refill her med for her through the mail order   AF (paroxysmal atrial fibrillation) (HCC) 10/28/2014   Anemia 04/14/2015   Last Assessment & Plan:  Update her CBC and ensure that this is stable for her   Anticoagulant long-term use 11/19/2016   Atrial fibrillation (HCC)    Chronic kidney disease, stage III (moderate) (HCC) 01/24/2018   Closed fracture of left distal radius 12/05/2020   Dizziness of unknown etiology 08/18/2021   DOE (dyspnea on exertion) 09/08/2021   Dyslipidemia 10/28/2014   Elevated brain natriuretic peptide (BNP) level 12/05/2020   Essential hypertension 10/28/2014   Last Assessment & Plan:  Formatting of this note might be different from the original. This was improved for her on the repeat and she is doing well   Hyperglycemia 04/14/2015   Last Assessment & Plan:  She is doing well with her diet and she is going to have labs drawn for her sugar overall   Hyperlipemia    Hypertension    Leg skin lesion, right 05/20/2020   Leiomyoma of uterus 04/14/2015   Long-term use of  high-risk medication 04/14/2015   Mild major depression (HCC) 09/07/2021   Mixed hyperlipidemia 04/14/2015   Last Assessment & Plan:  Update the lipids for her today and she is going to have this done   Moderate aortic regurgitation 12/05/2020   Osteopenia 03/22/2016   2012: -1.2   Persistent atrial fibrillation (HCC) 12/05/2020   Right carotid bruit 12/05/2020   Shortness of breath 08/18/2021   Stage 3a chronic kidney disease (HCC) 01/24/2018   Weakness 08/18/2021    Past  Surgical History:  Procedure Laterality Date   APPENDECTOMY     BREAST LUMPECTOMY     HAND SURGERY     KNEE SURGERY     TONSILLECTOMY      Current Medications: Current Meds  Medication Sig   Calcium Carbonate-Vitamin D 600-400 MG-UNIT per tablet Take 1 tablet by mouth 2 (two) times daily.   levothyroxine (SYNTHROID) 100 MCG tablet Take 100 mcg by mouth every morning.   lisinopril (ZESTRIL) 10 MG tablet Take 10 mg by mouth daily.   lovastatin (MEVACOR) 40 MG tablet Take 40 mg by mouth at bedtime.   metoprolol succinate (TOPROL-XL) 25 MG 24 hr tablet Take 1 tablet (25 mg total) by mouth daily.   [DISCONTINUED] amiodarone (PACERONE) 200 MG tablet Take 0.5 tablets (100 mg total) by mouth daily.   [DISCONTINUED] apixaban (ELIQUIS) 2.5 MG TABS tablet Take 1 tablet (2.5 mg total) by mouth 2 (two) times daily.     Allergies:   Patient has no known allergies.   Social History   Socioeconomic History   Marital status: Widowed    Spouse name: Not on file   Number of children: Not on file   Years of education: Not on file   Highest education level: Not on file  Occupational History   Occupation: Part Time [employer]    Comment: [employer]/[place]  Tobacco Use   Smoking status: Never   Smokeless tobacco: Never  Substance and Sexual Activity   Alcohol use: No    Alcohol/week: 0.0 standard drinks of alcohol   Drug use: No   Sexual activity: Not on file  Other Topics Concern   Not on file  Social History Narrative   Not on file   Social Determinants of Health   Financial Resource Strain: Not on file  Food Insecurity: Low Risk  (09/02/2022)   Received from [facility]   Hunger Vital Sign    Worried About Running Out of Food in the Last Year: Never true    Ran Out of Food in the Last Year: Never true  Transportation Needs: No Transportation Needs (09/02/2022)   Received from [facility]    In the past 12 months, has lack of reliable transportation kept you  from medical appointments, meetings, work or from getting things needed for daily living? : No  Physical Activity: Not on file  Stress: Not on file  Social Connections: Not on file     Family History: The patient's family history includes Alzheimer's disease in her sister; Breast cancer in her daughter; COPD in her brother and sister; Cancer in her mother; Multiple myeloma in her sister.  ROS:   Please see the history of present illness.    All other systems reviewed and are negative.  EKGs/Labs/Other Studies Reviewed:    The following studies were reviewed today:  EKG reveals atrial fibrillation with well-controlled ventricular rate.      Recent Labs: 11/11/2022: ALT 18; BUN 17; Creatinine, Ser 1.10;  Hemoglobin 11.2; Platelets 262; Potassium 3.4; Sodium 138; TSH 86.100  Recent Lipid Panel    Component Value Date/Time   CHOL 162 05/04/2021 0958   TRIG 99 05/04/2021 0958   HDL 52 05/04/2021 0958   CHOLHDL 3.1 05/04/2021 0958   LDLCALC 92 05/04/2021 0958    Physical Exam:    VS:  BP 130/76   Pulse 75   Ht 5\' 5"  (1.651 m)   Wt 115 lb 12.8 oz (52.5 kg)   SpO2 (!) 84%   BMI 19.27 kg/m     Wt Readings from Last 3 Encounters:  11/16/22 115 lb 12.8 oz (52.5 kg)  11/11/22 116 lb 3.2 oz (52.7 kg)  08/10/22 112 lb 12.8 oz (51.2 kg)     GEN: Patient is in no acute distress HEENT: Normal NECK: No JVD; No carotid bruits LYMPHATICS: No lymphadenopathy CARDIAC: Hear sounds irregular, 2/6 systolic murmur at the apex. RESPIRATORY:  Clear to auscultation without rales, wheezing or rhonchi  ABDOMEN: Soft, non-tender, non-distended MUSCULOSKELETAL:  No edema; No deformity  SKIN: Warm and dry NEUROLOGIC:  Alert and oriented x 3 PSYCHIATRIC:  Normal affect   Signed, Garwin Brothers, MD  11/16/2022 1:21 PM     [facility]

## 2022-12-06 ENCOUNTER — Other Ambulatory Visit: Payer: Self-pay | Admitting: Cardiology

## 2023-02-10 ENCOUNTER — Ambulatory Visit: Payer: Medicare Other | Admitting: Cardiology

## 2023-04-02 DEATH — deceased
